# Patient Record
Sex: Female | Born: 1980 | Race: White | Hispanic: No | Smoking: Never smoker
Health system: Southern US, Community
[De-identification: ages and names within clinical notes are randomized; demographics above are authoritative.]

## PROBLEM LIST (undated history)

## (undated) DIAGNOSIS — R87619 Unspecified abnormal cytological findings in specimens from cervix uteri: Secondary | ICD-10-CM

## (undated) DIAGNOSIS — Z8619 Personal history of other infectious and parasitic diseases: Secondary | ICD-10-CM

## (undated) DIAGNOSIS — L270 Generalized skin eruption due to drugs and medicaments taken internally: Secondary | ICD-10-CM

## (undated) DIAGNOSIS — IMO0002 Reserved for concepts with insufficient information to code with codable children: Secondary | ICD-10-CM

## (undated) DIAGNOSIS — T360X5A Adverse effect of penicillins, initial encounter: Secondary | ICD-10-CM

## (undated) HISTORY — DX: Personal history of other infectious and parasitic diseases: Z86.19

## (undated) HISTORY — DX: Adverse effect of penicillins, initial encounter: T36.0X5A

## (undated) HISTORY — PX: COLPOSCOPY: SHX161

## (undated) HISTORY — DX: Reserved for concepts with insufficient information to code with codable children: IMO0002

## (undated) HISTORY — PX: CHOLECYSTECTOMY: SHX55

## (undated) HISTORY — DX: Unspecified abnormal cytological findings in specimens from cervix uteri: R87.619

## (undated) HISTORY — PX: NO PAST SURGERIES: SHX2092

## (undated) HISTORY — DX: Generalized skin eruption due to drugs and medicaments taken internally: L27.0

---

## 2005-09-26 ENCOUNTER — Other Ambulatory Visit: Admission: RE | Admit: 2005-09-26 | Discharge: 2005-09-26 | Payer: Self-pay | Admitting: Obstetrics and Gynecology

## 2006-07-08 HISTORY — PX: CHOLECYSTECTOMY: SHX55

## 2006-11-05 ENCOUNTER — Ambulatory Visit: Payer: Self-pay | Admitting: Internal Medicine

## 2006-11-05 LAB — CONVERTED CEMR LAB
AST: 13 units/L (ref 0–37)
Albumin: 3.4 g/dL — ABNORMAL LOW (ref 3.5–5.2)
Alkaline Phosphatase: 42 units/L (ref 39–117)
Amylase: 41 units/L (ref 27–131)
BUN: 8 mg/dL (ref 6–23)
Basophils Absolute: 0 10*3/uL (ref 0.0–0.1)
Calcium: 9.1 mg/dL (ref 8.4–10.5)
Chloride: 105 meq/L (ref 96–112)
Cholesterol: 219 mg/dL (ref 0–200)
Direct LDL: 127.4 mg/dL
Eosinophils Absolute: 0.1 10*3/uL (ref 0.0–0.6)
GFR calc non Af Amer: 129 mL/min
HDL: 63.1 mg/dL (ref >39.0)
MCHC: 34.7 g/dL (ref 30.0–36.0)
MCV: 82.4 fL (ref 78.0–100.0)
Monocytes Relative: 4.1 % (ref 3.0–11.0)
Neutrophils Relative %: 60.3 % (ref 43.0–77.0)
Platelets: 296 10*3/uL (ref 150–400)
RBC: 4.77 M/uL (ref 3.87–5.11)
Sodium: 140 meq/L (ref 135–145)
Triglycerides: 207 mg/dL (ref 0–149)

## 2006-11-11 ENCOUNTER — Encounter: Admission: RE | Admit: 2006-11-11 | Discharge: 2006-11-11 | Payer: Self-pay | Admitting: Internal Medicine

## 2006-12-05 ENCOUNTER — Ambulatory Visit: Payer: Self-pay | Admitting: Internal Medicine

## 2007-01-19 ENCOUNTER — Ambulatory Visit: Payer: Self-pay | Admitting: Internal Medicine

## 2007-01-30 ENCOUNTER — Telehealth: Payer: Self-pay | Admitting: Internal Medicine

## 2007-02-02 ENCOUNTER — Ambulatory Visit: Payer: Self-pay | Admitting: Internal Medicine

## 2007-02-02 DIAGNOSIS — R1011 Right upper quadrant pain: Secondary | ICD-10-CM

## 2007-02-02 DIAGNOSIS — R519 Headache, unspecified: Secondary | ICD-10-CM | POA: Insufficient documentation

## 2007-02-02 DIAGNOSIS — R51 Headache: Secondary | ICD-10-CM

## 2007-02-09 ENCOUNTER — Encounter: Payer: Self-pay | Admitting: Internal Medicine

## 2007-02-12 ENCOUNTER — Telehealth: Payer: Self-pay | Admitting: Internal Medicine

## 2007-03-02 ENCOUNTER — Encounter: Payer: Self-pay | Admitting: Internal Medicine

## 2007-04-24 ENCOUNTER — Encounter: Payer: Self-pay | Admitting: Internal Medicine

## 2007-08-31 ENCOUNTER — Ambulatory Visit: Payer: Self-pay | Admitting: Internal Medicine

## 2007-08-31 DIAGNOSIS — J019 Acute sinusitis, unspecified: Secondary | ICD-10-CM

## 2007-09-11 ENCOUNTER — Ambulatory Visit: Payer: Self-pay | Admitting: Internal Medicine

## 2007-09-11 DIAGNOSIS — J029 Acute pharyngitis, unspecified: Secondary | ICD-10-CM | POA: Insufficient documentation

## 2007-09-12 ENCOUNTER — Encounter: Payer: Self-pay | Admitting: Internal Medicine

## 2007-09-15 LAB — CONVERTED CEMR LAB
ALT: 24 units/L (ref 0–35)
AST: 20 units/L (ref 0–37)
Basophils Relative: 0.3 % (ref 0.0–1.0)
Bilirubin, Direct: 0.1 mg/dL (ref 0.0–0.3)
CO2: 31 meq/L (ref 19–32)
Calcium: 9.8 mg/dL (ref 8.4–10.5)
Chloride: 104 meq/L (ref 96–112)
Eosinophils Relative: 1.8 % (ref 0.0–5.0)
Glucose, Bld: 75 mg/dL (ref 70–99)
Lymphocytes Relative: 31.6 % (ref 12.0–46.0)
Neutro Abs: 8.3 10*3/uL — ABNORMAL HIGH (ref 1.4–7.7)
Platelets: 281 10*3/uL (ref 150–400)
RBC: 4.81 M/uL (ref 3.87–5.11)
Total Protein: 7.3 g/dL (ref 6.0–8.3)
WBC: 13.3 10*3/uL — ABNORMAL HIGH (ref 4.5–10.5)

## 2007-09-16 ENCOUNTER — Telehealth: Payer: Self-pay | Admitting: Internal Medicine

## 2007-12-28 ENCOUNTER — Encounter: Payer: Self-pay | Admitting: Internal Medicine

## 2008-01-02 IMAGING — US US ABDOMEN COMPLETE
1 series · 14 of 25 positions shown · non-contrast
Comparison: none

CLINICAL DATA: Abdominal pain. 

 ABDOMEN ULTRASOUND:
TECHNIQUE: Complete abdominal ultrasound examination was performed including evaluation of the liver, gallbladder, bile ducts, pancreas, kidneys, spleen, IVC, and abdominal aorta.
 No comparison.

[Series 1: unknown · 0.30mm/px · 14 of 63 slices shown]
[im 1/63]
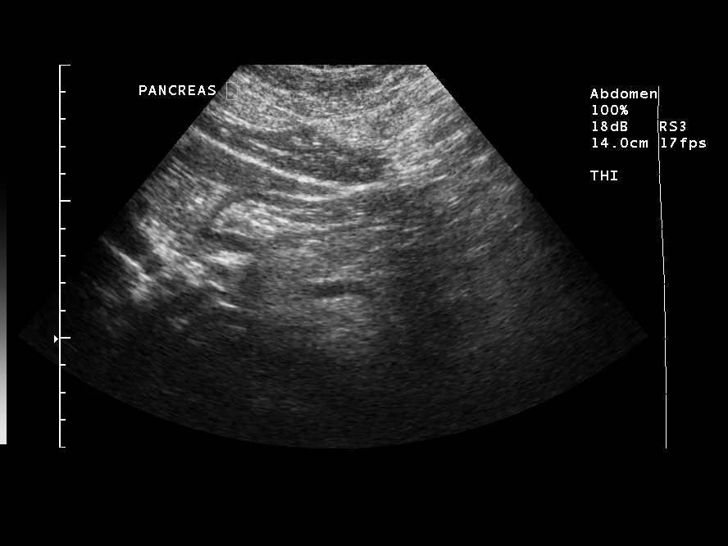
[im 6/63]
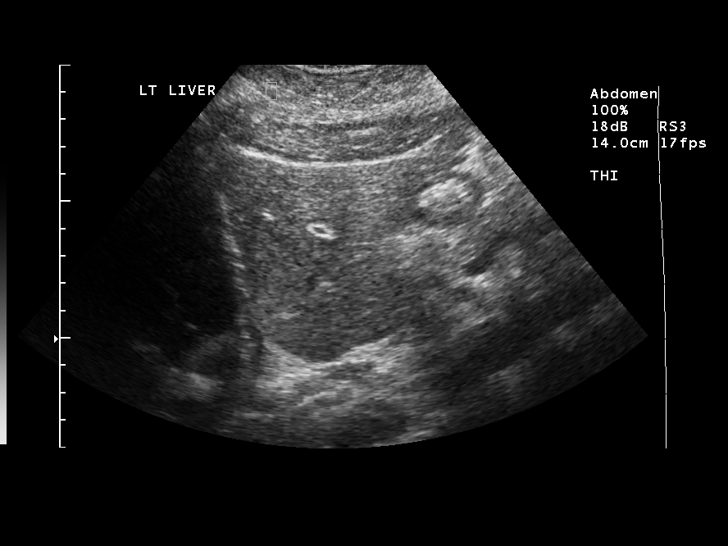
[im 11/63]
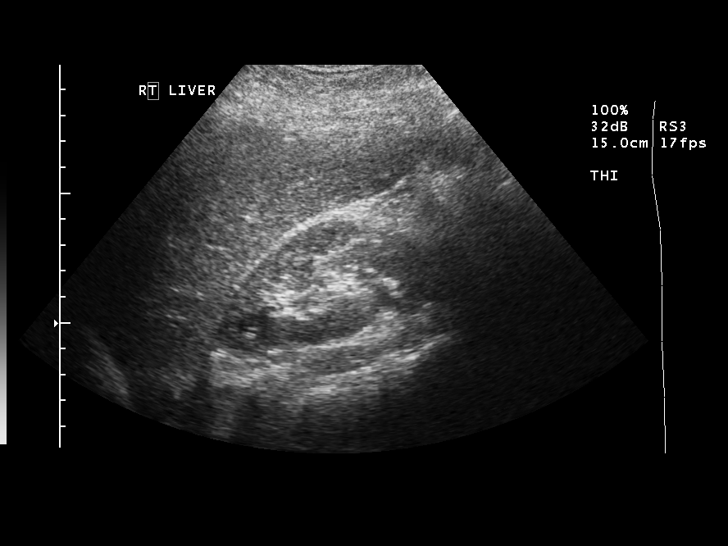
[im 16/63]
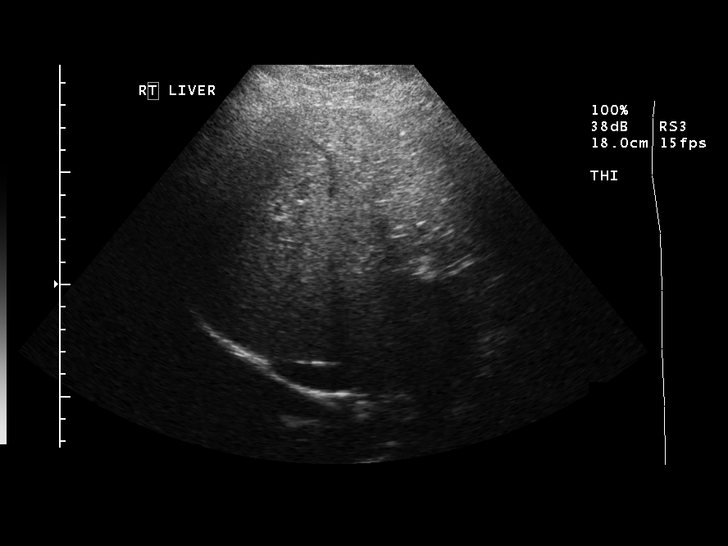
[im 21/63]
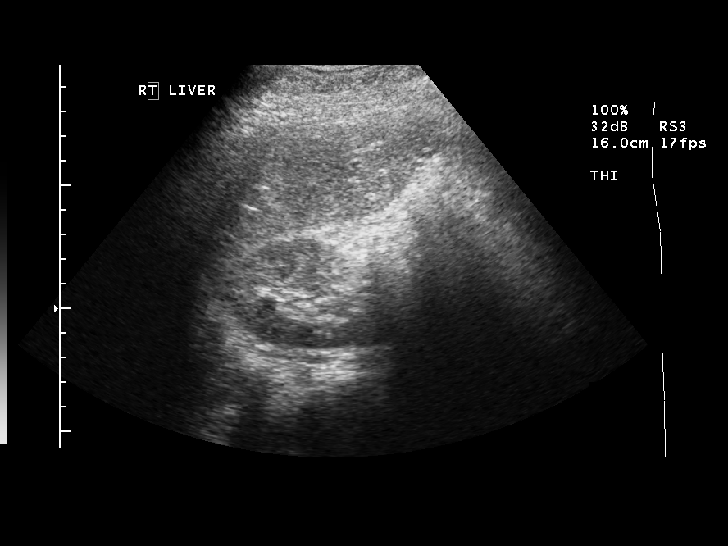
[im 24/63]
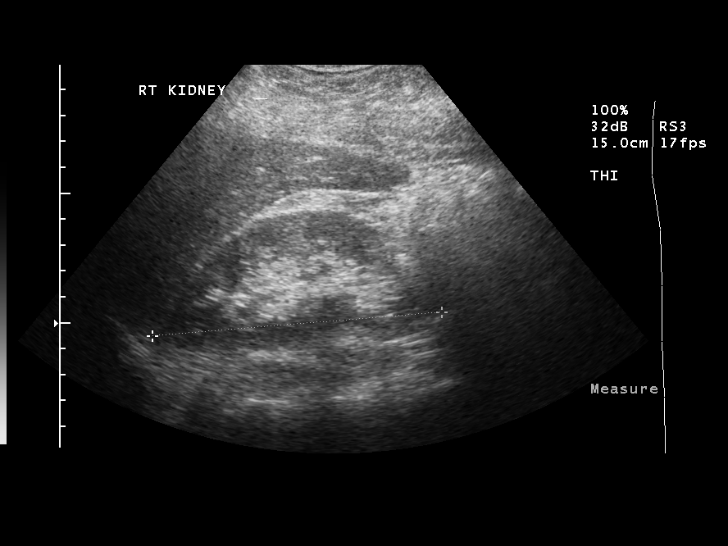
[im 29/63]
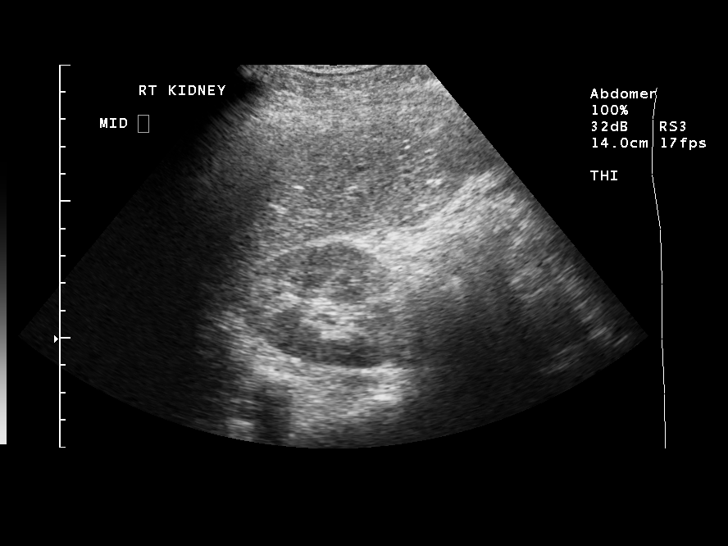
[im 34/63]
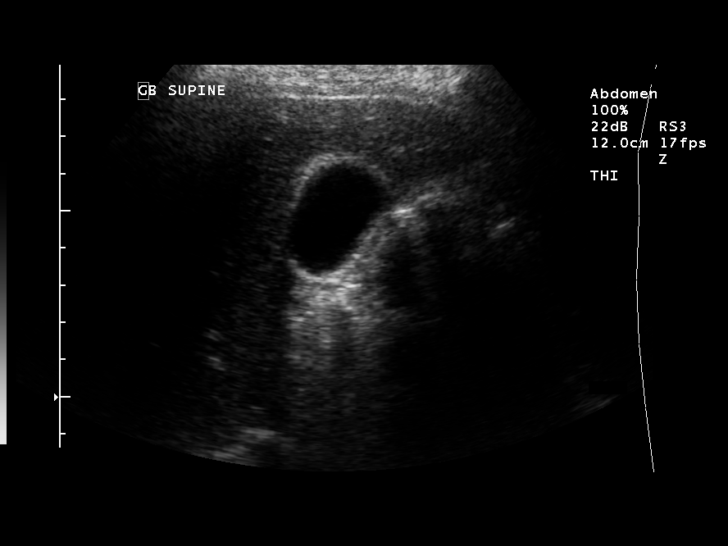
[im 39/63]
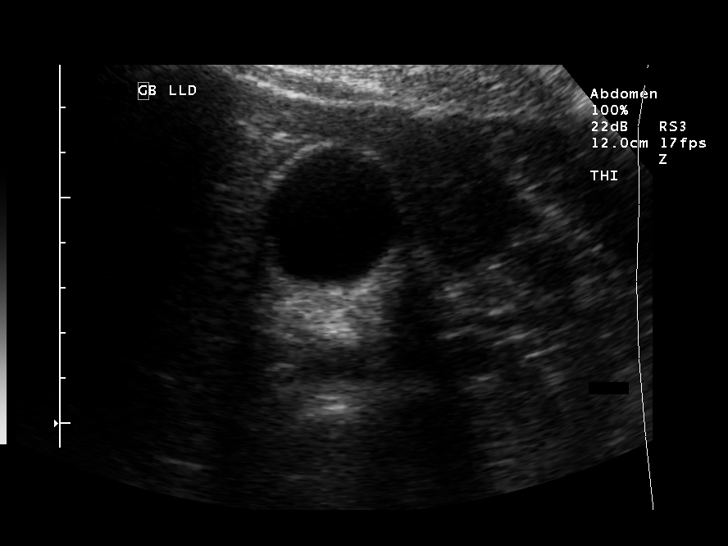
[im 42/63]
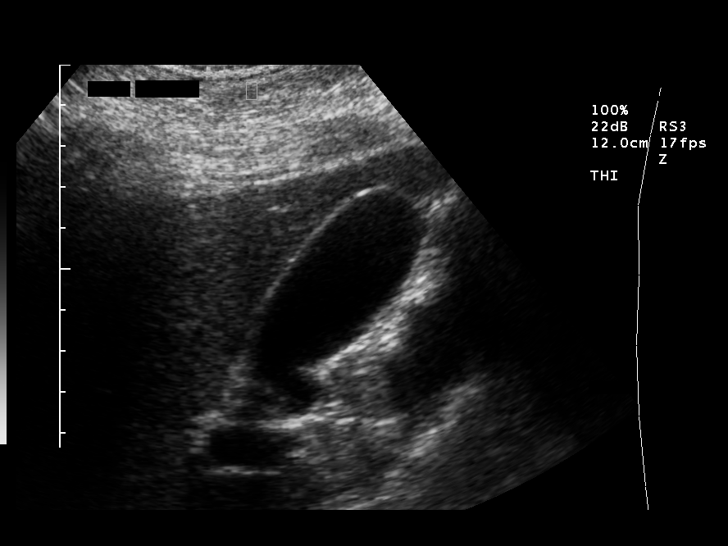
[im 47/63]
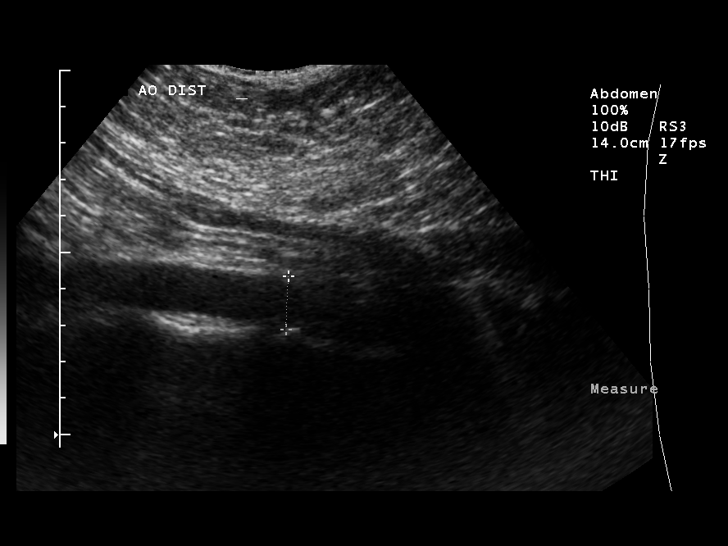
[im 52/63]
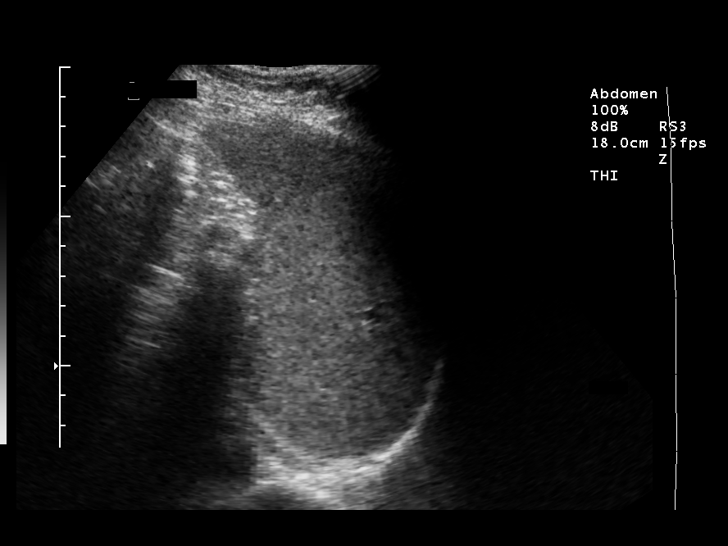
[im 57/63]
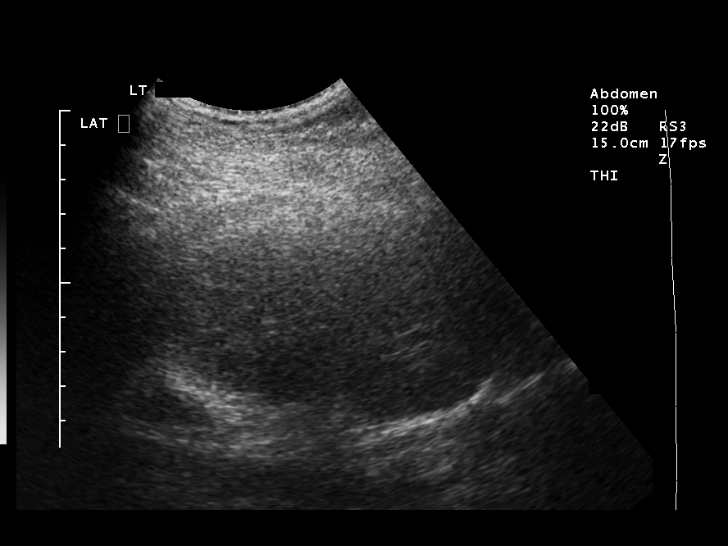
[im 63/63]
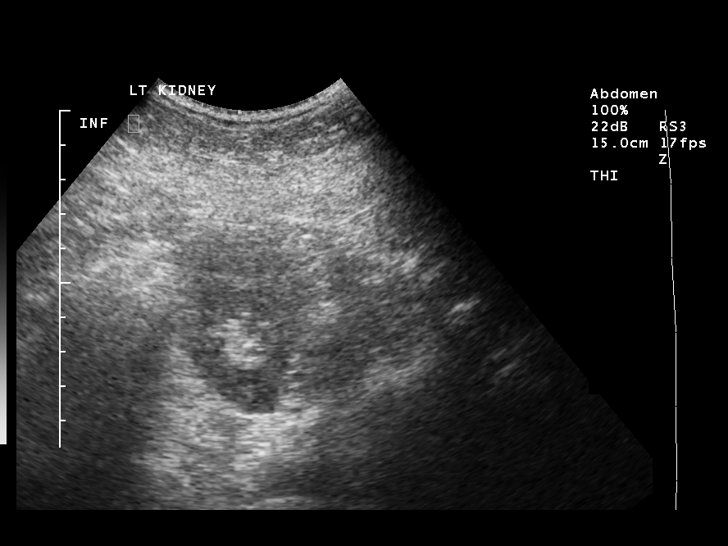

[14 of 25 positions shown; findings below may reference images not displayed]

FINDINGS: Limited evaluation of portions of the pancreas secondary to overlying bowel gas.  The portions visualized are unremarkable.  No focal hepatic lesion or intrahepatic biliary duct dilatation.  No gallstones, gallbladder wall thickening, or pericholecystic fluid.  The liver spans over 15 cm.  
 Right kidney 11.2 cm, left kidney 12.6 cm in length without evidence of hydronephrosis or focal renal mass.  Common bile duct 3 mm.  Aorta and inferior vena cava unremarkable.  The spleen spans over 12.4 cm in length without focal mass.
IMPRESSION: Slightly limited evaluation of portions of the pancreas secondary to overlying bowel gas.  Otherwise negative exam.

## 2008-01-26 ENCOUNTER — Ambulatory Visit: Payer: Self-pay | Admitting: Internal Medicine

## 2008-01-26 DIAGNOSIS — M129 Arthropathy, unspecified: Secondary | ICD-10-CM | POA: Insufficient documentation

## 2008-01-26 LAB — CONVERTED CEMR LAB
ALT: 15 units/L (ref 0–35)
Albumin: 4.1 g/dL (ref 3.5–5.2)
BUN: 9 mg/dL (ref 6–23)
Basophils Absolute: 0 10*3/uL (ref 0.0–0.1)
Basophils Relative: 0.4 % (ref 0.0–3.0)
Bilirubin Urine: NEGATIVE
CO2: 29 meq/L (ref 19–32)
CRP, High Sensitivity: 6 — ABNORMAL HIGH (ref 0.00–5.00)
Calcium: 9.3 mg/dL (ref 8.4–10.5)
Creatinine, Ser: 0.5 mg/dL (ref 0.4–1.2)
Eosinophils Relative: 1.2 % (ref 0.0–5.0)
Glucose, Urine, Semiquant: NEGATIVE
Hemoglobin: 13.5 g/dL (ref 12.0–15.0)
Lymphocytes Relative: 38.1 % (ref 12.0–46.0)
MCHC: 35.2 g/dL (ref 30.0–36.0)
MCV: 83 fL (ref 78.0–100.0)
Neutro Abs: 6.3 10*3/uL (ref 1.4–7.7)
Protein, U semiquant: NEGATIVE
RBC: 4.61 M/uL (ref 3.87–5.11)
Rhuematoid fact SerPl-aCnc: <20 intl units/mL — ABNORMAL LOW (ref 0.0–20.0)
Specific Gravity, Urine: 1.015
Total Bilirubin: 0.6 mg/dL (ref 0.3–1.2)
Total Protein: 7.3 g/dL (ref 6.0–8.3)
Urobilinogen, UA: 0.2

## 2008-02-15 ENCOUNTER — Telehealth: Payer: Self-pay | Admitting: Internal Medicine

## 2008-02-22 ENCOUNTER — Encounter: Payer: Self-pay | Admitting: Internal Medicine

## 2008-03-03 ENCOUNTER — Encounter: Payer: Self-pay | Admitting: Internal Medicine

## 2008-08-14 ENCOUNTER — Telehealth: Payer: Self-pay | Admitting: Family Medicine

## 2009-12-29 ENCOUNTER — Ambulatory Visit: Payer: Self-pay | Admitting: Internal Medicine

## 2009-12-29 DIAGNOSIS — K219 Gastro-esophageal reflux disease without esophagitis: Secondary | ICD-10-CM | POA: Insufficient documentation

## 2009-12-29 DIAGNOSIS — R197 Diarrhea, unspecified: Secondary | ICD-10-CM | POA: Insufficient documentation

## 2010-01-19 ENCOUNTER — Ambulatory Visit: Payer: Self-pay | Admitting: Internal Medicine

## 2010-01-22 ENCOUNTER — Encounter (INDEPENDENT_AMBULATORY_CARE_PROVIDER_SITE_OTHER): Payer: Self-pay | Admitting: *Deleted

## 2010-01-29 ENCOUNTER — Encounter: Payer: Self-pay | Admitting: Internal Medicine

## 2010-02-01 ENCOUNTER — Ambulatory Visit: Payer: Self-pay | Admitting: Internal Medicine

## 2010-02-01 LAB — CONVERTED CEMR LAB: IgA: 133 mg/dL (ref 68–378)

## 2010-04-02 ENCOUNTER — Ambulatory Visit: Payer: Self-pay | Admitting: Internal Medicine

## 2010-04-02 DIAGNOSIS — J309 Allergic rhinitis, unspecified: Secondary | ICD-10-CM | POA: Insufficient documentation

## 2010-04-05 ENCOUNTER — Ambulatory Visit: Payer: Self-pay | Admitting: Internal Medicine

## 2010-04-12 ENCOUNTER — Ambulatory Visit: Payer: Self-pay | Admitting: Internal Medicine

## 2010-04-12 DIAGNOSIS — J329 Chronic sinusitis, unspecified: Secondary | ICD-10-CM | POA: Insufficient documentation

## 2010-04-12 DIAGNOSIS — H6690 Otitis media, unspecified, unspecified ear: Secondary | ICD-10-CM | POA: Insufficient documentation

## 2010-08-07 NOTE — Assessment & Plan Note (Signed)
Summary: ?ear inf/njr   Vital Signs:  Patient profile:   30 year old female Menstrual status:  regular LMP:     03/20/2010 Weight:      186 pounds BMI:     31.06 Temp:     98.4 degrees F oral Pulse rate:   60 / minute BP sitting:   110 / 70  (left arm) Cuff size:   regular  Vitals Entered By: Romualdo Bolk, CMA (AAMA) (April 12, 2010 11:03 AM)  Nutrition Counseling: Patient's BMI is greater than 25 and therefore counseled on weight management options. CC: Pain in rt ear, Pt was also having a clogged, full feeling in both ears but worse in rt. This has been going on for 1.5 months. Pt states that she put [employer] brand of ear wax removal kit in her ears 2-3 days ago. Then this am when she put it only in her rt ear (7am) her ear started hurting. She gets a sharp pain when she yawns or moves jaw. It is also just a constant throbbing pain. LMP (date): 03/20/2010     Enter LMP: 03/20/2010   History of Present Illness: Felicia Shepard comes in today  for congestion for a while and ears clogged at least a week.  however over the last day got right ear pain and tried otc drops ( see above) but pain contniuing. no drainage.   No fever face pain. hard to hear from both ears.  No asthma wheezing  sob.  has anterior neck pain when gets congested. using nasal cortisone.   Saw Dr Leone Payor and dx more IBS and plan is to lose weight and is joining [employer] .   Preventive Screening-Counseling & Management  Alcohol-Tobacco     Alcohol drinks/day: <1     Alcohol type: mixed drinks     Smoking Status: never  Caffeine-Diet-Exercise     Caffeine use/day: 0     Does Patient Exercise: yes     Type of exercise: cardio and wts     Times/week: 3  Current Medications (verified): 1)  Prenatal Vitamins 0.8 Mg Tabs (Prenatal Multivit-Min-Fe-Fa) .... Take One By Mouth Once Daily 2)  Nexium 40 Mg Cpdr (Esomeprazole Magnesium) .Marland Kitchen.. 1 By Mouth 30-60 Minutes Before Breakfast 3)  Hyoscyamine  Sulfate 0.125 Mg Subl (Hyoscyamine Sulfate) .Marland Kitchen.. 1-2 Every 4 Hours As Needed For Abdominal Pain and Diarrhea 4)  Levocetirizine Dihydrochloride 5 Mg Tabs (Levocetirizine Dihydrochloride) .Marland Kitchen.. 1 By Mouth Once Daily 5)  Nasonex 50 Mcg/act Susp (Mometasone Furoate) .Marland Kitchen.. 1-2 Sprays in Each Nostril Qam 6)  Xopenex Hfa 45 Mcg/act Aero (Levalbuterol Tartrate) .Marland Kitchen.. 1-2 Puffs Q 6 Hours  Allergies (verified): 1)  ! Penicillin  Past History:  Past medical, surgical, family and social histories (including risk factors) reviewed, and no changes noted (except as noted below).  Past Medical History: Reviewed history from 04/05/2010 and no changes required. Headache GERD IBS-suspected  Past Surgical History: Reviewed history from 02/01/2010 and no changes required. Lt thumb surgery to loosen a ligament as a baby Cholecystectomy in 2008  Past History:  Care Management: Gynecology: Henderson Cloud Gastroenterology: Leone Payor Allergy: [place] Callas  Family History: Reviewed history from 02/01/2010 and no changes required. mother had leukemia  no IBD Sister with allergy.  12 YO  airborne  Family History of Prostate Cancer: paternal grandfather Family History of Diabetes: maternal grandfather, paternal grandmother Family History of Heart Disease: paternal grandmother, maternal grandfather No FH of Colon Cancer:  Social History: Reviewed history from 04/02/2010  and no changes required. 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   Review of Systems       The patient complains of decreased hearing.  The patient denies anorexia, fever, weight loss, vision loss, peripheral edema, prolonged cough, hemoptysis, melena, hematochezia, transient blindness, difficulty walking, depression, abnormal bleeding, enlarged lymph nodes, and  angioedema.         see hpi  Physical Exam  General:  alert, well-developed, well-nourished, and well-hydrated.  mildly congested non toxic in nad  Head:  normocephalic, atraumatic, and no abnormalities observed.   Eyes:  PERRL, EOMs full, conjunctiva clear  Ears:  L ear normal and no external deformities.   right eat with redened part faclida and yellow mildly bulging opaque pars tensa  middle of tim looks like  a bubble of fluid but no perf   ..no eac tenderness Nose:  congestedno external deformity and no external erythema.  face nontender Mouth:  pharynx pink and moist.   Neck:  shoddy ac nodes  minimally tender Lungs:  Normal respiratory effort, chest expands symmetrically. Lungs are clear to auscultation, no crackles or wheezes.no dullness.   Heart:  Normal rate and regular rhythm. S1 and S2 normal without gallop, murmur, click, rub or other extra sounds. Skin:  turgor normal and color normal.   Cervical Nodes:  shoddy ac noeg pc nodes  Psych:  Oriented X3, good eye contact, and not anxious appearing.     Impression & Recommendations:  Problem # 1:  OTITIS MEDIA, ACUTE, RIGHT (ICD-382.9) rx at sinusitis doses    hx of rash with pcn but can take amox    disc   low risk of se of med   Expectant management  Her updated medication list for this problem includes:    Cefdinir 300 Mg Caps (Cefdinir) .Marland Kitchen... 1 by mouth two times a day  for ear and sinus infection  Instructed on prevention and treatment. Call if no improvement in 48-72 hours or sooner if worsening symptoms.   Problem # 2:  SINUSITIS (ICD-473.9) subacute   with chronic signs so far and eustachian tube dysfunction     Her updated medication list for this problem includes:    Nasonex 50 Mcg/act Susp (Mometasone furoate) .Marland Kitchen... 1-2 sprays in each nostril qam    Cefdinir 300 Mg Caps (Cefdinir) .Marland Kitchen... 1 by mouth two times a day  for ear and sinus infection  Problem # 3:  obesity weight gain  agree with weight watchers    pt  to implement.   Problem # 4:  RHINITIS, VASOMOTOR (ICD-477.9) Assessment: Comment Only  Her updated medication list for this problem includes:    Levocetirizine Dihydrochloride 5 Mg Tabs (Levocetirizine dihydrochloride) .Marland Kitchen... 1 by mouth once daily    Nasonex 50 Mcg/act Susp (Mometasone furoate) .Marland Kitchen... 1-2 sprays in each nostril qam  Complete Medication List: 1)  Prenatal Vitamins 0.8 Mg Tabs (Prenatal multivit-min-fe-fa) .... Take one by mouth once daily 2)  Nexium 40 Mg Cpdr (Esomeprazole magnesium) .Marland Kitchen.. 1 by mouth 30-60 minutes before breakfast 3)  Hyoscyamine Sulfate 0.125 Mg Subl (Hyoscyamine sulfate) .Marland Kitchen.. 1-2 every 4 hours as needed for abdominal pain and diarrhea 4)  Levocetirizine Dihydrochloride 5 Mg Tabs (Levocetirizine dihydrochloride) .Marland Kitchen.. 1 by mouth once daily 5)  Nasonex 50  Mcg/act Susp (Mometasone furoate) .Marland Kitchen.. 1-2 sprays in each nostril qam 6)  Xopenex Hfa 45 Mcg/act Aero (Levalbuterol tartrate) .Marland Kitchen.. 1-2 puffs q 6 hours 7)  Cefdinir 300 Mg Caps (Cefdinir) .Marland Kitchen.. 1 by mouth two times a day  for ear and sinus infection  Patient Instructions: 1)  your have an ear infection and sinus infection probably secondary to your allergy congestion.   2)  expect pain to be bettter in 1-3 days. 3)  hearing should be better in 3-4 week s 4)  call if not better as expected .   Prescriptions: CEFDINIR 300 MG CAPS (CEFDINIR) 1 by mouth two times a day  for ear and sinus infection  #20 x 0   Entered and Authorized by:   Madelin Headings MD   Signed by:   Madelin Headings MD on 04/12/2010   Method used:   Electronically to        [facility] # [phone]* (retail)       [address]       [place], [place]  [address]       Ph: [phone]       Fax: [phone]   RxID:   [id number]

## 2010-08-07 NOTE — Assessment & Plan Note (Signed)
Summary: 2 month f/u//alp   Vital Signs:  Patient profile:   30 year old female Menstrual status:  regular LMP:     03/20/2010 Weight:      188 pounds Pulse rate:   78 / minute BP sitting:   110 / 70  (left arm) Cuff size:   regular  Vitals Entered By: Romualdo Bolk, CMA (AAMA) (April 02, 2010 1:21 PM) CC: Follow-up visit from GI and allergist LMP (date): 03/20/2010     Enter LMP: 03/20/2010   History of Present Illness: Felicia Shepard comes in today  for follow up of  a few issues from last visit.  Allergy:  Neg skin tests.    non allergic rhinitis.    beprev eye drops .    and then  xyzal.    Unclear if helps   and causes drowsiness. and not   nasonex yet.   still ocass cough.  GI  : prob IBS   and gerd  much better. weight loss rec .  no new signs  Plans  lifestyle intervention and exercise . No new signs    Preventive Screening-Counseling & Management  Alcohol-Tobacco     Alcohol drinks/day: <1     Alcohol type: mixed drinks     Smoking Status: never  Caffeine-Diet-Exercise     Caffeine use/day: 0     Does Patient Exercise: yes     Type of exercise: cardio and wts     Times/week: 3  Current Medications (verified): 1)  Prenatal Vitamins 0.8 Mg Tabs (Prenatal Multivit-Min-Fe-Fa) .... Take One By Mouth Once Daily 2)  Nexium 40 Mg Cpdr (Esomeprazole Magnesium) .Marland Kitchen.. 1 By Mouth 30-60 Minutes Before Breakfast 3)  Hyoscyamine Sulfate 0.125 Mg Subl (Hyoscyamine Sulfate) .Marland Kitchen.. 1-2 Every 4 Hours As Needed For Abdominal Pain and Diarrhea 4)  Levocetirizine Dihydrochloride 5 Mg Tabs (Levocetirizine Dihydrochloride) .Marland Kitchen.. 1 By Mouth Once Daily 5)  Nasonex 50 Mcg/act Susp (Mometasone Furoate) .Marland Kitchen.. 1-2 Sprays in Each Nostril Qam 6)  Xopenex Hfa 45 Mcg/act Aero (Levalbuterol Tartrate) .Marland Kitchen.. 1-2 Puffs Q 6 Hours  Allergies (verified): 1)  ! Penicillin  Past History:  Past medical, surgical, family and social histories (including risk factors) reviewed for relevance to  current acute and chronic problems.  Past Medical History: Reviewed history from 02/02/2007 and no changes required. Headache  Past Surgical History: Reviewed history from 02/01/2010 and no changes required. Lt thumb surgery to loosen a ligament as a baby Cholecystectomy in 2008  Past History:  Care Management: Gynecology: Henderson Cloud Gastroenterology: Leone Payor Allergy: [place] Callas  Family History: Reviewed history from 02/01/2010 and no changes required. mother had leukemia  no IBD Sister with allergy.  12 YO  airborne  Family History of Prostate Cancer: paternal grandfather Family History of Diabetes: maternal grandfather, paternal grandmother Family History of Heart Disease: paternal grandmother, maternal grandfather No FH of Colon Cancer:  Social History: Reviewed history from 02/01/2010 and no changes required. 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   Review of Systems  The patient denies anorexia, fever, weight loss, vision loss, prolonged cough, abdominal pain, melena, hematochezia, hematuria, transient blindness, difficulty walking, and angioedema.    Physical Exam  General:  Well-developed,well-nourished,in no acute distress; alert,appropriate and cooperative throughout examination Head:  normocephalic and atraumatic.  Eyes:  vision grossly intact.   Neck:  No deformities, masses, or tenderness noted. Lungs:  Normal respiratory effort, chest expands symmetrically. Lungs are clear to auscultation, no crackles or wheezes. Heart:  Normal rate and regular rhythm. S1 and S2 normal without gallop, murmur, click, rub or other extra sounds. Abdomen:  soft, non-tender, no distention, no masses, no hepatomegaly, and no splenomegaly.   Pulses:  nl cap refill  Neurologic:  non focal  Skin:   turgor normal, color normal, no ecchymoses, and no petechiae.   Cervical Nodes:  No lymphadenopathy noted Psych:  Oriented X3, good eye contact, not anxious appearing, and not depressed appearing.     Impression & Recommendations:  Problem # 1:  DIARRHEA (ICD-787.91) Assessment Improved prob IBS    Problem # 2:  GERD (ICD-530.81) Assessment: Improved plans on weight loss  Her updated medication list for this problem includes:    Nexium 40 Mg Cpdr (Esomeprazole magnesium) .Marland Kitchen... 1 by mouth 30-60 minutes before breakfast    Hyoscyamine Sulfate 0.125 Mg Subl (Hyoscyamine sulfate) .Marland Kitchen... 1-2 every 4 hours as needed for abdominal pain and diarrhea  Problem # 3:  ? of ALLERGY (ICD-995.3) non allergic resp signs   Problem # 4:  weight gain felt from  external factors    lifestyle intervention coubt thyroid or metabolic causes but  at next labs     get lipids  tsh etc.   Complete Medication List: 1)  Prenatal Vitamins 0.8 Mg Tabs (Prenatal multivit-min-fe-fa) .... Take one by mouth once daily 2)  Nexium 40 Mg Cpdr (Esomeprazole magnesium) .Marland Kitchen.. 1 by mouth 30-60 minutes before breakfast 3)  Hyoscyamine Sulfate 0.125 Mg Subl (Hyoscyamine sulfate) .Marland Kitchen.. 1-2 every 4 hours as needed for abdominal pain and diarrhea 4)  Levocetirizine Dihydrochloride 5 Mg Tabs (Levocetirizine dihydrochloride) .Marland Kitchen.. 1 by mouth once daily 5)  Nasonex 50 Mcg/act Susp (Mometasone furoate) .Marland Kitchen.. 1-2 sprays in each nostril qam 6)  Xopenex Hfa 45 Mcg/act Aero (Levalbuterol tartrate) .Marland Kitchen.. 1-2 puffs q 6 hours  Other Orders: Admin 1st Vaccine (42706) Flu Vaccine 54yrs + (23762)  Patient Instructions: 1)  follow up with specialists as discussed. 2)  after lifestyle intervention for weight contol suggest labs such as lipids   thyroid and metabolic panel  as these do not appear to have  been done recently . 3)  Call  us as needed  otherwise .      Flu Vaccine Consent Questions     Do you have a history of severe allergic  reactions to this vaccine? no    Any prior history of allergic reactions to egg and/or gelatin? no    Do you have a sensitivity to the preservative Thimersol? no    Do you have a past history of Guillan-Barre Syndrome? no    Do you currently have an acute febrile illness? no    Have you ever had a severe reaction to latex? no    Vaccine information given and explained to patient? yes    Are you currently pregnant? no    Lot Number:[id number]   Exp Date:01/05/2011   Site Given  Left Deltoid IMlu Romualdo Bolk, CMA (AAMA)  April 02, 2010 1:26 PM

## 2010-08-07 NOTE — Assessment & Plan Note (Signed)
Summary: diarrhea--ch.   History of Present Illness Visit Type: Initial Consult Primary GI MD: Stan Head MD [facility] Primary Provider: Berniece Andreas, MD Requesting Provider: Berniece Andreas, MD Chief Complaint: Increased Diarrhea History of Present Illness:   Patient complains of increased diarrhea since she had her gallbladder removed 2008, she does have a change in bowel habits from time to time that changes to constipation. She sometimes has a BM within 30 mins of a meal. She complains of lower abdominal pain which feels like menstral pain but not during that time of the month, the pain radiates to her back. The bowel habits are unpredictable and the urgent diarrhea is not every day. She states that she eats a balanced diet but now starting to develop reflux, she tried Nexium which worked great but insurance did not want to cover until she tried omeprazole.  heartburn and indigestion began about 2 months ago. She has already been to an allergist. No allergies seen. ? Non-allergic rhinitis. dr. [place] Callas as prescribed Xyzal, Nasonex and Bepuve eye drops bt she has not started.  Cholecystectomy was for gallbladder dyskinesia and she did get improvement in spells of severe sudden indigestion and nausea.  She has ganed 17# since 2008. She does not think she is eatng diferently. She lost 7# on weight watchers last summer. Workouts dropped off during gallbladder attacks and with some other issues has not picked up.   GI Review of Systems    Reports abdominal pain, acid reflux, and  bloating.     Location of  Abdominal pain: lower abdomen.    Denies belching, chest pain, dysphagia with liquids, dysphagia with solids, heartburn, loss of appetite, nausea, vomiting, vomiting blood, weight loss, and  weight gain.      Reports change in bowel habits, constipation, and  diarrhea.     Denies anal fissure, black tarry stools, diverticulosis, fecal incontinence, heme positive stool, hemorrhoids, irritable bowel  syndrome, jaundice, light color stool, liver problems, rectal bleeding, and  rectal pain. Preventive Screening-Counseling & Management      Drug Use:  no.      Current Medications (verified): 1)  Prenatal Vitamins 0.8 Mg Tabs (Prenatal Multivit-Min-Fe-Fa) .... Take One By Mouth Once Daily 2)  Omeprazole 20 Mg Cpdr (Omeprazole) .Marland Kitchen.. 1 By Mouth Once Daily  or As Directed  Allergies (verified): 1)  ! Penicillin  Past History:  Past Medical History: Reviewed history from 02/02/2007 and no changes required. Headache  Past Surgical History: Lt thumb surgery to loosen a ligament as a baby Cholecystectomy in 2008  Family History: mother had leukemia  no IBD Sister with allergy.  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         All other ROS negative except as per HPI.   Vital Signs:  Patient profile:   30 year old female Menstrual status:  regular Height:      65 inches Weight:      190.8 pounds BMI:     31.87 Pulse rate:   104 /  minute Pulse rhythm:   regular BP sitting:   118 / 70  (left arm) Cuff size:   regular  Vitals Entered By: Harlow Mares CMA [facility] Dull) (February 01, 2010 2:17 PM)  Physical Exam  General:  overweight to obese.   Impression & Recommendations:  Problem # 1:  GERD (ICD-530.81) Assessment Comment Only NEW to me Symptoms are fairly classic and responds to PPI. No worrisome signs.Nexium 40 mg more efficacious than omeprazole 20 mg daily.  Nexium 40 mg once daily x at least 2 months. Weight  loss.  Problem # 2:  DIARRHEA (ICD-787.91) Assessment: Comment Only NEW: Probably IBS as opposed to post chole diarrhea as she does get constipated at times. ? celiac disease, will check with Abs   Orders: T-Tissue Transglutamase Ab IgA (60454-09811) TLB-IgA (Immunoglobulin A) (82784-IGA)  Problem # 3:  ? of IBS (ICD-564.1) ? probiotic depending upon how she does with Nexium Levsin as needed for now with follow-up  Patient Instructions: 1)  Please pick up your medications at your pharmacy. 2)  New or changed medications are Nexium (GERD) and hyoscyamine (for abdominal pain/diarrhea) 3)  You need to lose weight. Start by limiting portions, amounts. Avoid eating when not hungry. Limit desserts.Look for high fructose corn syrup on food labels and if in first 3 ingredients, avoid that food. Also try to eat whole grains, avoid "white foods" (e.g. white rice, white bread).   4)  Please go to the basement to have your lab tests drawn today. 5)  Celiac testing. 6)  Please schedule a follow-up appointment in 2 months.  7)  Celiac Sprue brochure given.  8)  Copy sent to : Berniece Andreas, MD 9)  The medication list was reviewed and reconciled.  All changed / newly prescribed medications were explained.  A complete medication list was provided to the patient / caregiver. Prescriptions: HYOSCYAMINE SULFATE 0.125 MG SUBL (HYOSCYAMINE SULFATE) 1-2 every 4 hours as needed for abdominal pain and diarrhea  #60 x 1   Entered and Authorized by:   Iva Boop MD, [facility]   Signed by:   Iva Boop MD, FACG on 02/01/2010   Method used:   Electronically to        [facility] # [phone]* (retail)       [address]       [place], [place]  [address]       Ph: [phone]       Fax: [phone]   RxID:   [id number] NEXIUM 40 MG CPDR (ESOMEPRAZOLE MAGNESIUM) 1 by mouth 30-60 minutes before breakfast  #30 x 3   Entered and Authorized by:   Iva Boop MD, [facility]   Signed by:   Iva Boop  MD, FACG on 02/01/2010   Method used:   Electronically to        [facility] # [phone]* (retail)       [address]       [place], [place]  [address]       Ph: [phone]       Fax: [phone]   RxID:   [id number]

## 2010-08-07 NOTE — Assessment & Plan Note (Signed)
Summary: 3 mth f/u--ch.   History of Present Illness Visit Type: Follow-up Visit Primary GI MD: Stan Head MD [facility] Primary Provider: Berniece Andreas, MD Requesting Provider: na Chief Complaint: Follow up History of Present Illness:   30 yo woman seen about 2 months ago with heartburn and GERD problems as well as some diarrhea. She is doing well on Nexium re: heartburn and indigestion. Her diarrhea is better and her quality of life is ok, she says. She is losing weight and plans to shed significant weight over next year.   GI Review of Systems      Denies abdominal pain, acid reflux, belching, bloating, chest pain, dysphagia with liquids, dysphagia with solids, heartburn, loss of appetite, nausea, vomiting, vomiting blood, weight loss, and  weight gain.        Denies anal fissure, black tarry stools, change in bowel habit, constipation, diarrhea, diverticulosis, fecal incontinence, heme positive stool, hemorrhoids, irritable bowel syndrome, jaundice, light color stool, liver problems, rectal bleeding, and  rectal pain.    Current Medications (verified): 1)  Prenatal Vitamins 0.8 Mg Tabs (Prenatal Multivit-Min-Fe-Fa) .... Take One By Mouth Once Daily 2)  Nexium 40 Mg Cpdr (Esomeprazole Magnesium) .Marland Kitchen.. 1 By Mouth 30-60 Minutes Before Breakfast 3)  Hyoscyamine Sulfate 0.125 Mg Subl (Hyoscyamine Sulfate) .Marland Kitchen.. 1-2 Every 4 Hours As Needed For Abdominal Pain and Diarrhea 4)  Levocetirizine Dihydrochloride 5 Mg Tabs (Levocetirizine Dihydrochloride) .Marland Kitchen.. 1 By Mouth Once Daily 5)  Nasonex 50 Mcg/act Susp (Mometasone Furoate) .Marland Kitchen.. 1-2 Sprays in Each Nostril Qam 6)  Xopenex Hfa 45 Mcg/act Aero (Levalbuterol Tartrate) .Marland Kitchen.. 1-2 Puffs Q 6 Hours  Allergies (verified): 1)  ! Penicillin  Past History:  Past Surgical History: Last updated: 02/01/2010 Lt thumb surgery to loosen a ligament as a baby Cholecystectomy in 2008  Family History: Last updated: 02/01/2010 mother had leukemia  no  IBD Sister with allergy.  12 YO  airborne  Family History of Prostate Cancer: paternal grandfather Family History of Diabetes: maternal grandfather, paternal grandmother Family History of Heart Disease: paternal grandmother, maternal grandfather No FH of Colon Cancer:  Social History: Last updated: 04/02/2010 Married Never Smoked HH of  2  no pets  Occupation: self employed Chief Financial Officer Alcohol Use - yes 1-2 per week Daily Caffeine Use 1 per day Illicit Drug Use - no brother disabled going through guardian ship issues   with father .   Past Medical History: Headache GERD IBS-suspected  Review of Systems       ears feel plugged up  Vital Signs:  Patient profile:   30 year old female Menstrual status:  regular Height:      65 inches Weight:      184 pounds BMI:     30.73 Pulse rate:   76 / minute Pulse rhythm:   regular BP sitting:   110 / 68  (left arm) Cuff size:   regular  Vitals Entered By: Harlow Mares CMA Duncan Dull) (April 05, 2010 12:09 PM)  Physical Exam  General:  healthy appearing.  overweight   Impression & Recommendations:  Problem # 1:  GERD (ICD-530.81) Assessment Improved controlled on Nexium 40 mg once daily. She plans to lose weight and when more successful makes sense to try to taper off Nexium. She can follow-up with Dr. Fabian Sharp and see me as needed.  Problem # 2:  DIARRHEA (ICD-787.91) Assessment: Improved Has gotten better with PPI, ? cause and effect or true-true unrelated. Observe. Has not needed hyoscyamine.  Problem # 3:  ?  of IBS (ICD-564.1) Assessment: Improved I think she does have mild case most likely.  Patient Instructions: 1)  As you lose weight you can try to stop the Nexium by taking every other day for a few weeks then stopping. 2)  Please schedule a follow-up appointment as needed.  3)  You may refill Nexium through Dr. Fabian Sharp if needed. 4)  The medication list was reviewed and reconciled.  All changed / newly  prescribed medications were explained.  A complete medication list was provided to the patient / caregiver. Prescriptions: NEXIUM 40 MG CPDR (ESOMEPRAZOLE MAGNESIUM) 1 by mouth 30-60 minutes before breakfast  #30 x 11   Entered and Authorized by:   Iva Boop MD, [facility]   Signed by:   Iva Boop MD, FACG on 04/05/2010   Method used:   Electronically to        [facility] [address] # [phone]* (retail)       [address]       [place], [place]  [address]       Ph: [phone]       Fax: [phone]   RxID:   [id number]

## 2010-08-07 NOTE — Letter (Signed)
Summary: New Patient letter  [facility]  [address] [place], [place] [address]   Phone: [phone]  Fax: [phone]       01/22/2010 MRN: [id number]  [facility] [address] [place], [place]  [address]  Dear Ms. Forsberg,  Welcome to the Gastroenterology Division at [facility].    You are scheduled to see Dr.  Leone Payor on 02-01-10 at 2:00p.m. on the 3rd floor at [facility], [address]  We ask that you try to arrive at our office 15 minutes prior to your appointment time to allow for check-in.  We would like you to complete the enclosed self-administered evaluation form prior to your visit and bring it with you on the day of your appointment.  We will review it with you.  Also, please bring a complete list of all your medications or, if you prefer, bring the medication bottles and we will list them.  Please bring your insurance card so that we may make a copy of it.  If your insurance requires a referral to see a specialist, please bring your referral form from your primary care physician.  Co-payments are due at the time of your visit and may be paid by cash, check or credit card.     Your office visit will consist of a consult with your physician (includes a physical exam), any laboratory testing he/she may order, scheduling of any necessary diagnostic testing (e.g. x-ray, ultrasound, CT-scan), and scheduling of a procedure (e.g. Endoscopy, Colonoscopy) if required.  Please allow enough time on your schedule to allow for any/all of these possibilities.    If you cannot keep your appointment, please call [phone] to cancel or reschedule prior to your appointment date.  This allows [place] the opportunity to schedule an appointment for another patient in need of care.  If you do not cancel or reschedule by 5 p.m. the business day prior to your appointment date, you will be charged a $50.00 late cancellation/no-show fee.    Thank you for choosing  [facility] for your medical needs.  We appreciate the opportunity to care for you.  Please visit [place] at our website  to learn more about our practice.                     Sincerely,                                                             The Gastroenterology Division

## 2010-08-07 NOTE — Assessment & Plan Note (Signed)
Summary: consult re: allergy and indigestion issues/cjr   Vital Signs:  Patient profile:   30 year old female Menstrual status:  regular LMP:     12/11/2009 Height:      65 inches Weight:      189 pounds BMI:     31.56 Pulse rate:   80 / minute BP sitting:   120 / 80  (left arm) Cuff size:   regular  Vitals Entered By: Romualdo Bolk, CMA (AAMA) (December 29, 2009 1:46 PM) CC: Pt is here to discuss allergy issues, Pt is having pet allergies. Pt gets a deep cough and she would always get like this when she was around the dog. Her throat will close up, face gets itchy and swells up. Pt is also having some acid reflux. Pt states that it is random foods that she eats. Keeps her up at night when she eats. She has been having more diarrhea as well. This as well can be random foods that cause diarrhea. Pt did notice some blood in her stool for the past few months but saw the gyn and they done a stool test which was neg. LMP (date): 12/11/2009     Menstrual Status regular Enter LMP: 12/11/2009   History of Present Illness: Felicia Shepard comes in today  for above problems.  Post prandial diarrhea  for a while ocass constipation   sometimes watery.   Doesnt seem to be related to what she eats  but ? if  allergic or not.   Had GB out a few year ago and doesn't  remember this severity of signs .No travel related  GI issues. Also  noted cough and congestion with dog exposures  and other ?   could she be allergic?  Has heart burn and burning with certain foods  worse with supine  most recently . Just started pepcid and tums ? if help Has had  thyroid level tested in past per gyne. No recent labs   Preventive Screening-Counseling & Management  Alcohol-Tobacco     Alcohol drinks/day: <1     Alcohol type: mixed drinks     Smoking Status: never  Caffeine-Diet-Exercise     Caffeine use/day: 0     Does Patient Exercise: yes     Type of exercise: cardio and wts     Times/week: 3  Current  Medications (verified): 1)  Prenatal Vitamins 0.8 Mg Tabs (Prenatal Multivit-Min-Fe-Fa)  Allergies (verified): No Known Drug Allergies  Past History:  Past medical, surgical, family and social histories (including risk factors) reviewed, and no changes noted (except as noted below).  Past Medical History: Reviewed history from 02/02/2007 and no changes required. Headache  Past Surgical History: Reviewed history from 01/26/2008 and no changes required. Lt thumb surgery to loosen a ligament as a baby   Cholecystectomy  Past History:  Care Management: Gynecology: [facility]  Family History: Reviewed history from 01/26/2008 and no changes required. ncparent had leukemia  and fam hc of cancer  arthritis. ? no RA   no IBD Sister with allergy.  12 YO  airborne   Social History: Reviewed history from 08/31/2007 and no changes required. Married Never Smoked HH of  2  no pets Caffeine use/day:  0  Review of Systems       The patient complains of severe indigestion/heartburn.  The patient denies anorexia, fever, weight loss, vision loss, decreased hearing, chest pain, dyspnea on exertion, prolonged cough, abdominal pain, melena, hematochezia, muscle weakness, difficulty walking, depression,  abnormal bleeding, enlarged lymph nodes, and angioedema.         indigestion   otc     tums or otc. meds     Physical Exam  General:  Well-developed,well-nourished,in no acute distress; alert,appropriate and cooperative throughout examination Head:  normocephalic and atraumatic.   Eyes:  vision grossly intact, pupils equal, and pupils round.   Ears:  R ear normal and L ear normal.   Nose:  no external deformity and no external erythema.  mild congestion Mouth:  pharynx pink and moist.   Neck:  No deformities, masses, or tenderness noted. Lungs:  Normal respiratory effort, chest expands symmetrically. Lungs are clear to auscultation, no crackles or wheezes. Heart:  Normal rate and regular  rhythm. S1 and S2 normal without gallop, murmur, click, rub or other extra sounds. Abdomen:  Bowel sounds positive,abdomen soft and non-tender without masses, organomegaly or  noted. Pulses:  nl cap refill  Extremities:  no clubbing cyanosis or edema  Neurologic:  non focal  Skin:  turgor normal, color normal, no suspicious lesions, and no ecchymoses.   Cervical Nodes:  No lymphadenopathy noted Psych:  Oriented X3, normally interactive, good eye contact, not anxious appearing, and not depressed appearing.     Impression & Recommendations:  Problem # 1:  GERD (ICD-530.81)  Her updated medication list for this problem includes:    Nexium 40 Mg Cpdr (Esomeprazole magnesium) .Marland Kitchen... 1 by mouth once daily  Orders: Allergy Referral  (Allergy)  Problem # 2:  ? of ALLERGY (ICD-995.3)  cough to dog   seems reactive and    poss other allergies    reasonable to be evaluated  to asses .  Adults less likely to have food allergies but  consider eval for this with the GI signs   Orders: Allergy Referral  (Allergy)  Problem # 3:  post prandial diarrhea poss post choly  but newer onset   .      prob not food allergy but will refer for signs .   If persistent or  progressive  will get Gi involved   Complete Medication List: 1)  Prenatal Vitamins 0.8 Mg Tabs (Prenatal multivit-min-fe-fa) 2)  Nexium 40 Mg Cpdr (Esomeprazole magnesium) .Marland Kitchen.. 1 by mouth once daily  Patient Instructions: 1)  try nexium  each day for your heart burn 2)  avoid  late night eating and drinking. 3)  Will do a allergy referral 4)  Consider  empiric trail of   flagyl for the bowel problem  5)  If not improving   rec we get GI to see you.  6)  rov  in 3 weeks or as needed.

## 2010-08-07 NOTE — Assessment & Plan Note (Signed)
Summary: Follow up/cb   Vital Signs:  Patient profile:   30 year old female Menstrual status:  regular LMP:     01/09/2010 Weight:      189 pounds Pulse rate:   60 / minute BP sitting:   120 / 80  (left arm) Cuff size:   regular  Vitals Entered By: Romualdo Bolk, CMA (AAMA) (January 19, 2010 2:43 PM) CC: Follow-up visit on nexium LMP (date): 01/09/2010     Enter LMP: 01/09/2010   History of Present Illness: Felicia Shepard  comes in today  for follow up of a number of issues   GERD  nexium helps a lot .. got a good response almost immediatlely.    no se of med seen. on this for 2-3  weeks.  Diarrhea  :off and on    constipation  and then  loose stools and immedincy .  worse over the past year no fever or weightl loss. Cough  and coryza.  has appt with allergist soon  Preventive Screening-Counseling & Management  Alcohol-Tobacco     Alcohol drinks/day: <1     Alcohol type: mixed drinks     Smoking Status: never  Caffeine-Diet-Exercise     Caffeine use/day: 0     Does Patient Exercise: yes     Type of exercise: cardio and wts     Times/week: 3  Current Medications (verified): 1)  Prenatal Vitamins 0.8 Mg Tabs (Prenatal Multivit-Min-Fe-Fa) 2)  Nexium 40 Mg Cpdr (Esomeprazole Magnesium) .Marland Kitchen.. 1 By Mouth Once Daily  Allergies (verified): No Known Drug Allergies  Past History:  Past Surgical History: Lt thumb surgery to loosen a ligament as a baby   Cholecystectomy in 2008  Past History:  Care Management: Gynecology: [facility] PMH-FH-SH reviewed-no changes except otherwise noted  Review of Systems  The patient denies anorexia, fever, weight loss, vision loss, melena, hematochezia, incontinence, abnormal bleeding, and enlarged lymph nodes.         weight  gain over the past 3 years   Physical Exam  General:  Well-developed,well-nourished,in no acute distress; alert,appropriate and cooperative throughout examination Head:  normocephalic and atraumatic.     Eyes:  vision grossly intact.   Neck:  No deformities, masses, or tenderness noted. Lungs:  Normal respiratory effort, chest expands symmetrically. Lungs are clear to auscultation, no crackles or wheezes. Heart:  Normal rate and regular rhythm. S1 and S2 normal without gallop, murmur, click, rub or other extra sounds. Abdomen:  soft, non-tender, no distention, no masses, no hepatomegaly, and no splenomegaly.   Pulses:  pulses intact without delay   Skin:  turgor normal and color normal.   Cervical Nodes:  No lymphadenopathy noted   Impression & Recommendations:  Problem # 1:  GERD (ICD-530.81) Assessment Improved will change over to generic   trial  and plan is to try for about 2  months and do lifestyle intervention and then change over to  h2 blocker or  other and off.    Her updated medication list for this problem includes:    Nexium 40 Mg Cpdr (Esomeprazole magnesium) .Marland Kitchen... 1 by mouth once daily    Omeprazole 20 Mg Cpdr (Omeprazole) .Marland Kitchen... 1 by mouth once daily  or as directed  Problem # 2:  DIARRHEA (ICD-787.91)  irreg bowel habits    poss dates all the way back to her Chole  but not sure.    problematic for her   and interrupts   red responsibilities  Orders: Gastroenterology Referral (  GI)  Problem # 3:  ? of ALLERGY (ICD-995.3) consult pending.  Complete Medication List: 1)  Prenatal Vitamins 0.8 Mg Tabs (Prenatal multivit-min-fe-fa) 2)  Nexium 40 Mg Cpdr (Esomeprazole magnesium) .Marland Kitchen.. 1 by mouth once daily 3)  Omeprazole 20 Mg Cpdr (Omeprazole) .Marland Kitchen.. 1 by mouth once daily  or as directed  Patient Instructions: 1)  weight loss 5 pounds can help reflux    2)  Trial of generic omeprazole    for now for the next 2-3 month  . 3)  Lifestyle intervention  in addition.   to help reflux control. 4)  ROV in 2 months   5)  will  arrange a  GI consult about the bowel problems in the meantime.  Prescriptions: OMEPRAZOLE 20 MG CPDR (OMEPRAZOLE) 1 by mouth once daily  or as directed   #30 x 3   Entered and Authorized by:   Madelin Headings MD   Signed by:   Madelin Headings MD on 01/19/2010   Method used:   Electronically to        [facility] # [phone]* (retail)       [address]       [place], [place]  [address]       Ph: [phone]       Fax: [phone]   RxID:   [id number]

## 2010-08-07 NOTE — Consult Note (Signed)
Summary: [facility]  [facility]   Imported By: Maryln Gottron 02/06/2010 15:53:36  _____________________________________________________________________  External Attachment:    Type:   Image     Comment:   External Document

## 2010-08-16 ENCOUNTER — Telehealth: Payer: Self-pay | Admitting: *Deleted

## 2010-08-16 NOTE — Telephone Encounter (Signed)
Notified pt. 

## 2010-08-16 NOTE — Telephone Encounter (Signed)
Go ahead and refer 

## 2010-08-16 NOTE — Telephone Encounter (Signed)
Pt continues to have ringing in her ears, and even unusual sounds at times.  Would like a referral to ENT.

## 2010-11-23 NOTE — Assessment & Plan Note (Signed)
[facility] OFFICE NOTE   JOHNATHON, MITTAL                           MRN:          [id number]  DATE:11/05/2006                            DOB:          1981/03/09    CHIEF COMPLAINT:  New patient to establish for problem with stomach.   HISTORY OF PRESENT ILLNESS:  Ms. Goga is a 30 year old, nonsmoking,  married white female who comes in today for the above reason.  She was  previously under the care of [facility] but wants to change  primary physician in regard to this particular problem.  She was in her  usual state of health until October 2007 when she did become married,  and since that time has gained about 20 pounds for no reason that she  can tell.  Notes a right upper scapular shoulder pain in about December  that was felt to be muscle spasm from sitting at the computer and  treated with a muscle relaxant.  However, since January she has had a  problem with nausea and GE reflux type symptoms, most recently  postprandial nausea, about an hour later has diarrhea after eating with  no associated bloody diarrhea.  Has had occasional vomiting with that.  She has had negative EPT a couple of times, although she is on birth  control pills.  Over the last month she has felt that her exercise  capacity is down for no particular reason, although she has not been  exercising because she doesn't feel well.  She does sleep well at  night.  No symptoms of obstructive sleep apnea, no chest pain or  shortness of breath except as above.   REVIEW OF SYSTEMS:  Negative for significant edema.  She does have  migraine headaches about 2-3 times a week, possibly related to working  with the computer screen.  Occasionally takes Excedrin or Advil.  She  did have side effects from Imitrex in the past.  GU: Negative.  SKIN:  No acute changes.  No fevers, bloody diarrhea.  There was blood in stool  at some point, but her  previous doctor felt that it was a perianal  issue.  The rest of the Review of Systems is negative or  noncontributory.  She has had her thyroid checked.  She comes in with  labs today.  See chart.   PAST MEDICAL HISTORY:   SURGERIES:  None.   She is primiparous.  Last Pap February 2008.  LMP September 20, 2006.  Chicken pox as a child.  Headaches as above.   MEDICATIONS:  1. Mondonesi birth control pill.  2. Omeprazole 20 mg which she has been on since January.   DRUG ALLERGIES:  None recorded.   FAMILY HISTORY:  Negative for GI disease, thyroid, or osteoporosis.  Mother died at age 18 from leukemia fairly suddenly.  Father with  hypertension, elevated cholesterol, grandparent's generation with type 2  diabetes, hypertension, heart disease, and elevated cholesterol.   SOCIAL HISTORY:  Household of 2.  No recent travel.  They did go to  [place] on their honeymoon in the fall but had no GI symptoms at that  time, and her husband has no GI symptoms.  Two to six alcoholic  beverages a month at most.  No tobacco.  One to two diet Coke's a day.  There was no caffeine except for the Excedrin.  Exercises fairly  regularly 30-45 minutes 2-3 times a week.  She gets 8-9 hours of sleep.   PHYSICAL EXAMINATION:  VITAL SIGNS:  Height 5 feet 5-1/2 inches.  Weight  172.  Pulse 72 and regular, blood pressure 120/80.  GENERAL: Healthy-appearing young adult in no acute distress.  She is not  icteric.  HEENT:  Grossly normal.  TMs clear.  Oropharynx clear.  NECK:  Without masses.  Thyroid palpable, but no nodules are noted.  No  JVD.  CHEST:  Clear to auscultation and percussion, equal.  CARDIAC:  S1, S2.  There is a 1/6 systolic ejection murmur when lying  with no click or radiation at the sternal border.  EXTREMITIES:  Peripheral pulses are present without delay.  Negative  cyanosis, clubbing, or edema.  ABDOMEN:  Soft without organomegaly, guarding, or rebound.  NEUROLOGIC:  Intact.    LABORATORY DATA:  Laboratories done 3 weeks ago at [facility] was  normal except for blood sugar of 132.  Sed rate 1.07.  Hemoglobin 13.1,  hematocrit 36.4, MCV borderline low at 10.6, RDW 11.   IMPRESSION:  1. Abdominal symptoms, sound like reflux, although I would not rule      out biliary disease and suggest we get an abdominal ultrasound, H.      pylori, CBC, sed rate.  Repeat her LFTs and urinalysis today and a      lipid panel as she has not had this done.  2. History of increase in weight over the last 6 months.  This is      probably related to lifestyle.  3. Fatigue.  Unclear if this is deconditioning exercise intolerance,      although her exam is okay today as far as cardiovascular.  She does      have a 1/6 flow murmur,  probably insignificant.  RV in about one      month after putting her on samples of Nexium 1 a day, getting      ultrasound and labs and following up at that time or p.r.n.     Neta Mends. Panosh, MD  Electronically Signed    WKP/MedQ  DD: 11/05/2006  DT: 11/05/2006  Job #: [id number]

## 2012-02-18 ENCOUNTER — Ambulatory Visit (INDEPENDENT_AMBULATORY_CARE_PROVIDER_SITE_OTHER): Payer: BC Managed Care – PPO | Admitting: Family Medicine

## 2012-02-18 VITALS — BP 106/62 | HR 76 | Temp 98.2°F | Resp 16 | Ht 65.0 in | Wt 180.0 lb

## 2012-02-18 DIAGNOSIS — M549 Dorsalgia, unspecified: Secondary | ICD-10-CM

## 2012-02-18 DIAGNOSIS — Z3490 Encounter for supervision of normal pregnancy, unspecified, unspecified trimester: Secondary | ICD-10-CM

## 2012-02-18 LAB — POCT URINALYSIS DIPSTICK
Bilirubin, UA: NEGATIVE
Glucose, UA: NEGATIVE
Ketones, UA: NEGATIVE
Spec Grav, UA: 1.01

## 2012-02-18 LAB — POCT UA - MICROSCOPIC ONLY
Bacteria, U Microscopic: NEGATIVE
Crystals, Ur, HPF, POC: NEGATIVE
RBC, urine, microscopic: NEGATIVE

## 2012-02-18 MED ORDER — CYCLOBENZAPRINE HCL 5 MG PO TABS: ORAL_TABLET | ORAL | Status: DC

## 2012-02-18 NOTE — Patient Instructions (Addendum)

## 2012-02-18 NOTE — Progress Notes (Addendum)
Subjective: 31 year old female with back pain and right flank. This started over the weekend when they're at the beach. She did not do anything that she knows of to cause it to be painful restrained. It hurts to take a deep breath or cough, or to move in certain angles. She has sudden acute spasms of pain sometimes when she moves wrong. She has not had a lot of these troubles in the past. She work doing Chief Financial Officer. She has not been doing a lot of physical activity. She is pregnant about 12 weeks, her first pregnancy. She called her OB/GYN who recommended she see a primary care.  She has not had any urinary symptoms or fever. She has a little residual cough, but has not been ill with a respiratory tract problems. She's had her gallbladder out.  Objective: Pleasant alert young lady in no acute distress. Her chest was clear. Chest wall is nontender. She's tender in the right paraspinous muscles of the lower back. Motion of the back is good until she twisted to the left and she had intense pain of the right low back. Abdomen was soft without masses or tenderness. Uterus is not significantly palpable abdominally yet.  Assessment: Back strain First trimester pregnancy  Plan: Flexeril (pregnancy category B.) and ibuprofen and heat Check urinalysis  Results for orders placed in visit on 02/18/12  POCT URINALYSIS DIPSTICK      Component Value Range   Color, UA yellow     Clarity, UA clear     Glucose, UA neg     Bilirubin, UA neg     Ketones, UA neg     Spec Grav, UA 1.010     Blood, UA trace-lysed     pH, UA 7.0     Protein, UA neg     Urobilinogen, UA 0.2     Nitrite, UA neg     Leukocytes, UA Negative    POCT UA - MICROSCOPIC ONLY      Component Value Range   WBC, Ur, HPF, POC 0-1     RBC, urine, microscopic neg     Bacteria, U Microscopic neg     Mucus, UA neg     Epithelial cells, urine per micros 0-1     Crystals, Ur, HPF, POC neg     Casts, Ur, LPF, POC neg     Yeast, UA neg      rx's back

## 2012-02-21 LAB — OB RESULTS CONSOLE HIV ANTIBODY (ROUTINE TESTING): HIV: NONREACTIVE

## 2012-02-21 LAB — OB RESULTS CONSOLE RUBELLA ANTIBODY, IGM: Rubella: IMMUNE

## 2012-02-21 LAB — OB RESULTS CONSOLE ABO/RH

## 2012-02-21 LAB — OB RESULTS CONSOLE ANTIBODY SCREEN: Antibody Screen: NEGATIVE

## 2012-02-21 LAB — OB RESULTS CONSOLE HEPATITIS B SURFACE ANTIGEN: Hepatitis B Surface Ag: NEGATIVE

## 2012-08-16 ENCOUNTER — Telehealth: Payer: Self-pay

## 2012-08-16 NOTE — Telephone Encounter (Signed)
RTC is my guess. Please advise.

## 2012-08-16 NOTE — Telephone Encounter (Signed)
Pt is [redacted] weeks pregnant and has been exposed to pink eye and she woke up today with symptoms, would like to know if it is possible to call something in so that she is not exposed to more things in the lobby  Best number [phone]

## 2012-08-17 NOTE — Telephone Encounter (Signed)
RTC

## 2012-08-18 NOTE — Telephone Encounter (Signed)
Left message for Roseanne to call me back. Unfortunately she will need office visit.

## 2012-08-18 NOTE — Telephone Encounter (Signed)
Called patient again, left detailed message for her explaining need for office visit.

## 2012-09-05 ENCOUNTER — Encounter (HOSPITAL_COMMUNITY): Payer: Self-pay | Admitting: *Deleted

## 2012-09-05 ENCOUNTER — Inpatient Hospital Stay (HOSPITAL_COMMUNITY)
Admission: AD | Admit: 2012-09-05 | Discharge: 2012-09-05 | Disposition: A | Discharge status: Home / Self Care | Payer: BC Managed Care – PPO | Source: Ambulatory Visit | Attending: Obstetrics & Gynecology | Admitting: Obstetrics & Gynecology

## 2012-09-05 DIAGNOSIS — O479 False labor, unspecified: Secondary | ICD-10-CM | POA: Insufficient documentation

## 2012-09-05 NOTE — MAU Note (Signed)
Membranes stripped today at office visit about 1600. Regular ctxs since 1930.

## 2012-09-05 NOTE — MAU Note (Signed)
PT SAYS SHE HURT BAD AT 730PM- THEN WORSE   AT 0130.  VE IN OFFICE 3-4 CM  .   DENIES HSV AND MRSA

## 2012-09-07 ENCOUNTER — Encounter (HOSPITAL_COMMUNITY): Payer: Self-pay

## 2012-09-07 ENCOUNTER — Telehealth (HOSPITAL_COMMUNITY): Payer: Self-pay | Admitting: *Deleted

## 2012-09-07 ENCOUNTER — Encounter (HOSPITAL_COMMUNITY): Payer: Self-pay | Admitting: *Deleted

## 2012-09-07 ENCOUNTER — Inpatient Hospital Stay (HOSPITAL_COMMUNITY)
Admission: AD | Admit: 2012-09-07 | Discharge: 2012-09-11 | DRG: 371 | Disposition: A | Discharge status: Home / Self Care | Payer: BC Managed Care – PPO | Source: Ambulatory Visit | Attending: Obstetrics and Gynecology | Admitting: Obstetrics and Gynecology

## 2012-09-07 DIAGNOSIS — O3660X0 Maternal care for excessive fetal growth, unspecified trimester, not applicable or unspecified: Secondary | ICD-10-CM | POA: Diagnosis present

## 2012-09-07 DIAGNOSIS — O34219 Maternal care for unspecified type scar from previous cesarean delivery: Secondary | ICD-10-CM

## 2012-09-07 DIAGNOSIS — O4290 Premature rupture of membranes, unspecified as to length of time between rupture and onset of labor, unspecified weeks of gestation: Secondary | ICD-10-CM | POA: Diagnosis present

## 2012-09-07 LAB — CBC
HCT: 35.8 % — ABNORMAL LOW (ref 36.0–46.0)
Hemoglobin: 12 g/dL (ref 12.0–15.0)
MCH: 28.8 pg (ref 26.0–34.0)
MCHC: 33.5 g/dL (ref 30.0–36.0)

## 2012-09-07 MED ORDER — CITRIC ACID-SODIUM CITRATE 334-500 MG/5ML PO SOLN
30.0000 mL | ORAL | Status: DC | PRN
  Administered 2012-09-07 – 2012-09-09 (×3): 30 mL via ORAL
  Filled 2012-09-07 (×3): qty 15

## 2012-09-07 MED ORDER — LACTATED RINGERS IV SOLN
INTRAVENOUS | Status: DC
  Administered 2012-09-07 – 2012-09-09 (×6): via INTRAVENOUS

## 2012-09-07 MED ORDER — OXYTOCIN BOLUS FROM INFUSION: 500.0000 mL | INTRAVENOUS | Status: DC

## 2012-09-07 MED ORDER — OXYCODONE-ACETAMINOPHEN 5-325 MG PO TABS: 1.0000 | ORAL_TABLET | ORAL | Status: DC | PRN

## 2012-09-07 MED ORDER — ONDANSETRON HCL 4 MG/2ML IJ SOLN
4.0000 mg | Freq: Four times a day (QID) | INTRAMUSCULAR | Status: DC | PRN
  Administered 2012-09-08: 4 mg via INTRAVENOUS
  Filled 2012-09-07 (×2): qty 2

## 2012-09-07 MED ORDER — ACETAMINOPHEN 325 MG PO TABS
650.0000 mg | ORAL_TABLET | ORAL | Status: DC | PRN
  Administered 2012-09-09: 650 mg via ORAL
  Filled 2012-09-07: qty 2

## 2012-09-07 MED ORDER — FLEET ENEMA 7-19 GM/118ML RE ENEM: 1.0000 | ENEMA | RECTAL | Status: DC | PRN

## 2012-09-07 MED ORDER — EPHEDRINE 5 MG/ML INJ: 10.0000 mg | INTRAVENOUS | Status: DC | PRN

## 2012-09-07 MED ORDER — OXYTOCIN 40 UNITS IN LACTATED RINGERS INFUSION - SIMPLE MED: 62.5000 mL/h | INTRAVENOUS | Status: DC

## 2012-09-07 MED ORDER — PHENYLEPHRINE 40 MCG/ML (10ML) SYRINGE FOR IV PUSH (FOR BLOOD PRESSURE SUPPORT)
80.0000 ug | PREFILLED_SYRINGE | INTRAVENOUS | Status: DC | PRN
  Filled 2012-09-07: qty 5

## 2012-09-07 MED ORDER — LIDOCAINE HCL (PF) 1 % IJ SOLN: 30.0000 mL | INTRAMUSCULAR | Status: DC | PRN

## 2012-09-07 MED ORDER — LACTATED RINGERS IV SOLN
500.0000 mL | Freq: Once | INTRAVENOUS | Status: AC
  Administered 2012-09-08: 13:00:00 via INTRAVENOUS

## 2012-09-07 MED ORDER — OXYTOCIN 40 UNITS IN LACTATED RINGERS INFUSION - SIMPLE MED
1.0000 m[IU]/min | INTRAVENOUS | Status: DC
  Administered 2012-09-07: 2 m[IU]/min via INTRAVENOUS
  Administered 2012-09-08: 28 m[IU]/min via INTRAVENOUS
  Administered 2012-09-08: 20 m[IU]/min via INTRAVENOUS
  Filled 2012-09-07: qty 1000

## 2012-09-07 MED ORDER — IBUPROFEN 600 MG PO TABS: 600.0000 mg | ORAL_TABLET | Freq: Four times a day (QID) | ORAL | Status: DC | PRN

## 2012-09-07 MED ORDER — LACTATED RINGERS IV SOLN
500.0000 mL | INTRAVENOUS | Status: DC | PRN
  Administered 2012-09-08: 1000 mL via INTRAVENOUS
  Administered 2012-09-08: 500 mL via INTRAVENOUS

## 2012-09-07 MED ORDER — DIPHENHYDRAMINE HCL 50 MG/ML IJ SOLN: 12.5000 mg | INTRAMUSCULAR | Status: DC | PRN

## 2012-09-07 MED ORDER — EPHEDRINE 5 MG/ML INJ
10.0000 mg | INTRAVENOUS | Status: DC | PRN
  Filled 2012-09-07: qty 4

## 2012-09-07 MED ORDER — PHENYLEPHRINE 40 MCG/ML (10ML) SYRINGE FOR IV PUSH (FOR BLOOD PRESSURE SUPPORT): 80.0000 ug | PREFILLED_SYRINGE | INTRAVENOUS | Status: DC | PRN

## 2012-09-07 MED ORDER — TERBUTALINE SULFATE 1 MG/ML IJ SOLN
0.2500 mg | Freq: Once | INTRAMUSCULAR | Status: AC | PRN
  Filled 2012-09-07: qty 1

## 2012-09-07 MED ORDER — FENTANYL 2.5 MCG/ML BUPIVACAINE 1/10 % EPIDURAL INFUSION (WH - ANES)
14.0000 mL/h | INTRAMUSCULAR | Status: DC
  Administered 2012-09-08 – 2012-09-09 (×2): 14 mL/h via EPIDURAL
  Filled 2012-09-07 (×3): qty 125

## 2012-09-07 NOTE — Telephone Encounter (Signed)
Preadmission screen  

## 2012-09-08 LAB — TYPE AND SCREEN
ABO/RH(D): A POS
Antibody Screen: NEGATIVE

## 2012-09-08 LAB — COMPREHENSIVE METABOLIC PANEL
ALT: 9 U/L (ref 0–35)
AST: 14 U/L (ref 0–37)
CO2: 22 mEq/L (ref 19–32)
Chloride: 102 mEq/L (ref 96–112)
GFR calc non Af Amer: >90 mL/min (ref >90)
Sodium: 137 mEq/L (ref 135–145)
Total Bilirubin: 0.2 mg/dL — ABNORMAL LOW (ref 0.3–1.2)

## 2012-09-08 LAB — CBC
MCHC: 33.6 g/dL (ref 30.0–36.0)
Platelets: 153 10*3/uL (ref 150–400)
RDW: 13.6 % (ref 11.5–15.5)

## 2012-09-08 LAB — ABO/RH: ABO/RH(D): A POS

## 2012-09-08 MED ORDER — FENTANYL 2.5 MCG/ML BUPIVACAINE 1/10 % EPIDURAL INFUSION (WH - ANES)
INTRAMUSCULAR | Status: DC | PRN
  Administered 2012-09-08: 14 mL/h via EPIDURAL

## 2012-09-08 MED ORDER — LACTATED RINGERS IV SOLN
INTRAVENOUS | Status: DC
  Administered 2012-09-08: via INTRAUTERINE

## 2012-09-08 MED ORDER — LIDOCAINE HCL (PF) 1 % IJ SOLN
INTRAMUSCULAR | Status: DC | PRN
  Administered 2012-09-08 (×2): 8 mL

## 2012-09-08 NOTE — Anesthesia Procedure Notes (Signed)
Epidural Patient location during procedure: OB Start time: 09/08/2012 2:38 PM End time: 09/08/2012 2:42 PM  Staffing Anesthesiologist: Sandrea Hughs Performed by: anesthesiologist   Preanesthetic Checklist Completed: patient identified, site marked, surgical consent, pre-op evaluation, timeout performed, IV checked, risks and benefits discussed and monitors and equipment checked  Epidural Patient position: sitting Prep: site prepped and draped and DuraPrep Patient monitoring: continuous pulse ox and blood pressure Approach: midline Injection technique: LOR air  Needle:  Needle type: Tuohy  Needle gauge: 17 G Needle length: 9 cm and 9 Needle insertion depth: 5 cm cm Catheter type: closed end flexible Catheter size: 19 Gauge Catheter at skin depth: 10 cm Test dose: negative and Other  Assessment Sensory level: T9 Events: blood not aspirated, injection not painful, no injection resistance, negative IV test and no paresthesia  Additional Notes Reason for block:procedure for pain

## 2012-09-08 NOTE — Progress Notes (Signed)
Epidural request, MD ordering recent CBC

## 2012-09-08 NOTE — Progress Notes (Signed)
Pt comfortable.  SVE 5/C/-2 FHTs 120s gstv, NST R, class 1  Continue.

## 2012-09-08 NOTE — H&P (Signed)
32 y.o. [redacted]w[redacted]d  G1P0 comes in c/o of gross ROM about 8pm.  Otherwise has good fetal movement and no bleeding.  No contractions since ROM.  Past Medical History  Diagnosis Date  . Abnormal Pap smear   . Hx of varicella     Past Surgical History  Procedure Laterality Date  . Cholecystectomy    . Cholecystectomy  2008  . Colposcopy    . No past surgeries      OB History   Grav Para Term Preterm Abortions TAB SAB Ect Mult Living   1              # Outc Date GA Lbr Len/2nd Wgt Sex Del Anes PTL Lv   1 CUR               History   Social History  . Marital Status: Married    Spouse Name: N/A    Number of Children: N/A  . Years of Education: N/A   Occupational History  . Not on file.   Social History Main Topics  . Smoking status: Never Smoker   . Smokeless tobacco: Not on file  . Alcohol Use: No  . Drug Use: No  . Sexually Active: Yes   Other Topics Concern  . Not on file   Social History Narrative  . No narrative on file   [place]    Prenatal Transfer Tool  Maternal Diabetes: No Genetic Screening: Declined Fetal Ultrasounds or other Referrals:  Other:  normal anatomy scan Maternal Substance Abuse:  No Significant Maternal Medications:  None Significant Maternal Lab Results: Lab values include: Group B Strep negative  Other PNC: Patient had another [place] last week for suspected S>D, [place] not yet available but per pt, baby was est 8#3 and was 90%.      Filed Vitals:   09/08/12 0701  BP: 130/65  Pulse: 79  Temp:   Resp:      Lungs/Cor:  NAD Abdomen:  soft, gravid Ex:  no cords, erythema SVE:  3 in office and in MAU FHTs: 120 good STV, NST R Toco:  q irreg currently   A/P   Admit to L&D for PROM with suspected LGA Can wait one hr then if not contracting start pitocin 2x2  GBS Neg per pt - obtain report from office     [place], [facility]

## 2012-09-08 NOTE — Progress Notes (Signed)
From office:  GBS was neg and last u/s on 2-20 was 8#3. FHTs 120s NST R, gstv, class 1

## 2012-09-08 NOTE — Anesthesia Preprocedure Evaluation (Signed)
Anesthesia Evaluation  Patient identified by MRN, date of birth, ID band Patient awake    Reviewed: Allergy & Precautions, H&P , NPO status , Patient's Chart, lab work & pertinent test results  Airway Mallampati: I TM Distance: >3 FB Neck ROM: full    Dental no notable dental hx.    Pulmonary neg pulmonary ROS,    Pulmonary exam normal       Cardiovascular negative cardio ROS      Neuro/Psych negative psych ROS   GI/Hepatic negative GI ROS, Neg liver ROS,   Endo/Other  negative endocrine ROS  Renal/GU negative Renal ROS  negative genitourinary   Musculoskeletal negative musculoskeletal ROS (+)   Abdominal Normal abdominal exam  (+)   Peds  Hematology negative hematology ROS (+)   Anesthesia Other Findings   Reproductive/Obstetrics (+) Pregnancy                           Anesthesia Physical Anesthesia Plan  ASA: II  Anesthesia Plan: Epidural   Post-op Pain Management:    Induction:   Airway Management Planned:   Additional Equipment:   Intra-op Plan:   Post-operative Plan:   Informed Consent: I have reviewed the patients History and Physical, chart, labs and discussed the procedure including the risks, benefits and alternatives for the proposed anesthesia with the patient or authorized representative who has indicated his/her understanding and acceptance.     Plan Discussed with:   Anesthesia Plan Comments:         Anesthesia Quick Evaluation

## 2012-09-09 ENCOUNTER — Encounter (HOSPITAL_COMMUNITY)
Admission: AD | Disposition: A | Discharge status: Home / Self Care | Payer: Self-pay | Source: Ambulatory Visit | Attending: Obstetrics and Gynecology

## 2012-09-09 ENCOUNTER — Inpatient Hospital Stay (HOSPITAL_COMMUNITY): Payer: BC Managed Care – PPO | Admitting: Anesthesiology

## 2012-09-09 ENCOUNTER — Encounter (HOSPITAL_COMMUNITY): Payer: Self-pay | Admitting: Anesthesiology

## 2012-09-09 SURGERY — Surgical Case
Anesthesia: Epidural | Site: Abdomen | Wound class: Clean Contaminated

## 2012-09-09 MED ORDER — SODIUM CHLORIDE 0.9 % IJ SOLN: 3.0000 mL | INTRAMUSCULAR | Status: DC | PRN

## 2012-09-09 MED ORDER — METOCLOPRAMIDE HCL 5 MG/ML IJ SOLN
10.0000 mg | Freq: Three times a day (TID) | INTRAMUSCULAR | Status: DC | PRN
  Administered 2012-09-09: 10 mg via INTRAVENOUS
  Filled 2012-09-09: qty 2

## 2012-09-09 MED ORDER — DIPHENHYDRAMINE HCL 50 MG/ML IJ SOLN: 25.0000 mg | INTRAMUSCULAR | Status: DC | PRN

## 2012-09-09 MED ORDER — OXYCODONE-ACETAMINOPHEN 5-325 MG PO TABS
1.0000 | ORAL_TABLET | ORAL | Status: DC | PRN
  Administered 2012-09-10 (×2): 1 via ORAL
  Filled 2012-09-09 (×2): qty 1

## 2012-09-09 MED ORDER — LACTATED RINGERS IV SOLN
INTRAVENOUS | Status: DC | PRN
  Administered 2012-09-09: 05:00:00 via INTRAVENOUS

## 2012-09-09 MED ORDER — ONDANSETRON HCL 4 MG/2ML IJ SOLN
4.0000 mg | INTRAMUSCULAR | Status: DC | PRN
  Administered 2012-09-09: 4 mg via INTRAVENOUS
  Filled 2012-09-09: qty 2

## 2012-09-09 MED ORDER — NALOXONE HCL 0.4 MG/ML IJ SOLN: 0.4000 mg | INTRAMUSCULAR | Status: DC | PRN

## 2012-09-09 MED ORDER — KETOROLAC TROMETHAMINE 60 MG/2ML IM SOLN
INTRAMUSCULAR | Status: AC
  Administered 2012-09-09: 60 mg via INTRAMUSCULAR
  Filled 2012-09-09: qty 2

## 2012-09-09 MED ORDER — MEPERIDINE HCL 25 MG/ML IJ SOLN: 6.2500 mg | INTRAMUSCULAR | Status: DC | PRN

## 2012-09-09 MED ORDER — FENTANYL CITRATE 0.05 MG/ML IJ SOLN
INTRAMUSCULAR | Status: AC
  Administered 2012-09-09: 50 ug
  Filled 2012-09-09: qty 2

## 2012-09-09 MED ORDER — MEASLES, MUMPS & RUBELLA VAC ~~LOC~~ INJ
0.5000 mL | INJECTION | Freq: Once | SUBCUTANEOUS | Status: DC
  Filled 2012-09-09: qty 0.5

## 2012-09-09 MED ORDER — IBUPROFEN 600 MG PO TABS
600.0000 mg | ORAL_TABLET | Freq: Four times a day (QID) | ORAL | Status: DC
  Administered 2012-09-09 – 2012-09-11 (×7): 600 mg via ORAL
  Filled 2012-09-09 (×4): qty 1

## 2012-09-09 MED ORDER — METHYLERGONOVINE MALEATE 0.2 MG PO TABS: 0.2000 mg | ORAL_TABLET | ORAL | Status: DC | PRN

## 2012-09-09 MED ORDER — DIBUCAINE 1 % RE OINT: 1.0000 "application " | TOPICAL_OINTMENT | RECTAL | Status: DC | PRN

## 2012-09-09 MED ORDER — ONDANSETRON HCL 4 MG/2ML IJ SOLN
INTRAMUSCULAR | Status: DC | PRN
  Administered 2012-09-09: 4 mg via INTRAVENOUS

## 2012-09-09 MED ORDER — NALOXONE HCL 1 MG/ML IJ SOLN
1.0000 ug/kg/h | INTRAVENOUS | Status: DC | PRN
  Filled 2012-09-09: qty 2

## 2012-09-09 MED ORDER — MORPHINE SULFATE (PF) 0.5 MG/ML IJ SOLN
INTRAMUSCULAR | Status: DC | PRN
  Administered 2012-09-09: 4 mg via EPIDURAL

## 2012-09-09 MED ORDER — CYCLOBENZAPRINE HCL 10 MG PO TABS
10.0000 mg | ORAL_TABLET | Freq: Three times a day (TID) | ORAL | Status: DC | PRN
  Filled 2012-09-09: qty 1

## 2012-09-09 MED ORDER — NALBUPHINE SYRINGE 5 MG/0.5 ML
5.0000 mg | INJECTION | INTRAMUSCULAR | Status: DC | PRN
  Filled 2012-09-09: qty 1

## 2012-09-09 MED ORDER — DIPHENHYDRAMINE HCL 25 MG PO CAPS
25.0000 mg | ORAL_CAPSULE | ORAL | Status: DC | PRN
  Filled 2012-09-09: qty 1

## 2012-09-09 MED ORDER — SIMETHICONE 80 MG PO CHEW: 80.0000 mg | CHEWABLE_TABLET | ORAL | Status: DC | PRN

## 2012-09-09 MED ORDER — FAMOTIDINE 20 MG PO TABS
20.0000 mg | ORAL_TABLET | Freq: Two times a day (BID) | ORAL | Status: DC
  Administered 2012-09-10 – 2012-09-11 (×2): 20 mg via ORAL
  Filled 2012-09-09 (×6): qty 1

## 2012-09-09 MED ORDER — ONDANSETRON HCL 4 MG/2ML IJ SOLN: 4.0000 mg | Freq: Three times a day (TID) | INTRAMUSCULAR | Status: DC | PRN

## 2012-09-09 MED ORDER — FERROUS SULFATE 325 (65 FE) MG PO TABS
325.0000 mg | ORAL_TABLET | Freq: Two times a day (BID) | ORAL | Status: DC
  Administered 2012-09-10 – 2012-09-11 (×3): 325 mg via ORAL
  Filled 2012-09-09 (×3): qty 1

## 2012-09-09 MED ORDER — ONDANSETRON HCL 4 MG PO TABS: 4.0000 mg | ORAL_TABLET | ORAL | Status: DC | PRN

## 2012-09-09 MED ORDER — OXYTOCIN 40 UNITS IN LACTATED RINGERS INFUSION - SIMPLE MED: 62.5000 mL/h | INTRAVENOUS | Status: AC

## 2012-09-09 MED ORDER — FENTANYL CITRATE 0.05 MG/ML IJ SOLN: 25.0000 ug | INTRAMUSCULAR | Status: DC | PRN

## 2012-09-09 MED ORDER — KETOROLAC TROMETHAMINE 30 MG/ML IJ SOLN: 30.0000 mg | Freq: Four times a day (QID) | INTRAMUSCULAR | Status: AC | PRN

## 2012-09-09 MED ORDER — MEPERIDINE HCL 25 MG/ML IJ SOLN
INTRAMUSCULAR | Status: AC
  Filled 2012-09-09: qty 1

## 2012-09-09 MED ORDER — TETANUS-DIPHTH-ACELL PERTUSSIS 5-2.5-18.5 LF-MCG/0.5 IM SUSP
0.5000 mL | Freq: Once | INTRAMUSCULAR | Status: AC
  Administered 2012-09-10: 0.5 mL via INTRAMUSCULAR
  Filled 2012-09-09: qty 0.5

## 2012-09-09 MED ORDER — LANOLIN HYDROUS EX OINT: 1.0000 "application " | TOPICAL_OINTMENT | CUTANEOUS | Status: DC | PRN

## 2012-09-09 MED ORDER — BISACODYL 10 MG RE SUPP: 10.0000 mg | Freq: Every day | RECTAL | Status: DC | PRN

## 2012-09-09 MED ORDER — KETOROLAC TROMETHAMINE 60 MG/2ML IM SOLN
60.0000 mg | Freq: Once | INTRAMUSCULAR | Status: AC | PRN
  Filled 2012-09-09: qty 2

## 2012-09-09 MED ORDER — SODIUM BICARBONATE 8.4 % IV SOLN
INTRAVENOUS | Status: DC | PRN
  Administered 2012-09-09: 5 mL via EPIDURAL

## 2012-09-09 MED ORDER — KETOROLAC TROMETHAMINE 30 MG/ML IJ SOLN
30.0000 mg | Freq: Four times a day (QID) | INTRAMUSCULAR | Status: AC | PRN
  Administered 2012-09-09: 30 mg via INTRAVENOUS
  Filled 2012-09-09: qty 1

## 2012-09-09 MED ORDER — WITCH HAZEL-GLYCERIN EX PADS: 1.0000 "application " | MEDICATED_PAD | CUTANEOUS | Status: DC | PRN

## 2012-09-09 MED ORDER — PHENYLEPHRINE HCL 10 MG/ML IJ SOLN
INTRAMUSCULAR | Status: DC | PRN
  Administered 2012-09-09 (×5): 80 ug via INTRAVENOUS

## 2012-09-09 MED ORDER — SENNOSIDES-DOCUSATE SODIUM 8.6-50 MG PO TABS
2.0000 | ORAL_TABLET | Freq: Every day | ORAL | Status: DC
  Administered 2012-09-09 – 2012-09-10 (×2): 2 via ORAL

## 2012-09-09 MED ORDER — SCOPOLAMINE 1 MG/3DAYS TD PT72
MEDICATED_PATCH | TRANSDERMAL | Status: AC
  Filled 2012-09-09: qty 1

## 2012-09-09 MED ORDER — ZOLPIDEM TARTRATE 5 MG PO TABS: 5.0000 mg | ORAL_TABLET | Freq: Every evening | ORAL | Status: DC | PRN

## 2012-09-09 MED ORDER — LACTATED RINGERS IV SOLN
INTRAVENOUS | Status: DC
  Administered 2012-09-09 (×2): via INTRAVENOUS

## 2012-09-09 MED ORDER — SIMETHICONE 80 MG PO CHEW
80.0000 mg | CHEWABLE_TABLET | Freq: Three times a day (TID) | ORAL | Status: DC
  Administered 2012-09-09 – 2012-09-11 (×7): 80 mg via ORAL

## 2012-09-09 MED ORDER — CEFAZOLIN SODIUM-DEXTROSE 2-3 GM-% IV SOLR
INTRAVENOUS | Status: DC | PRN
  Administered 2012-09-09: 2 g via INTRAVENOUS

## 2012-09-09 MED ORDER — METHYLERGONOVINE MALEATE 0.2 MG/ML IJ SOLN: 0.2000 mg | INTRAMUSCULAR | Status: DC | PRN

## 2012-09-09 MED ORDER — PHENYLEPHRINE 40 MCG/ML (10ML) SYRINGE FOR IV PUSH (FOR BLOOD PRESSURE SUPPORT)
PREFILLED_SYRINGE | INTRAVENOUS | Status: AC
  Filled 2012-09-09: qty 10

## 2012-09-09 MED ORDER — MEPERIDINE HCL 25 MG/ML IJ SOLN
INTRAMUSCULAR | Status: DC | PRN
  Administered 2012-09-09: 25 mg via INTRAVENOUS

## 2012-09-09 MED ORDER — SCOPOLAMINE 1 MG/3DAYS TD PT72
1.0000 | MEDICATED_PATCH | Freq: Once | TRANSDERMAL | Status: DC
  Administered 2012-09-09: 1.5 mg via TRANSDERMAL

## 2012-09-09 MED ORDER — CLINDAMYCIN PHOSPHATE 900 MG/50ML IV SOLN
900.0000 mg | Freq: Once | INTRAVENOUS | Status: AC
  Administered 2012-09-09: 900 mg via INTRAVENOUS
  Filled 2012-09-09: qty 50

## 2012-09-09 MED ORDER — METOCLOPRAMIDE HCL 5 MG/ML IJ SOLN: 10.0000 mg | Freq: Once | INTRAMUSCULAR | Status: DC | PRN

## 2012-09-09 MED ORDER — DIPHENHYDRAMINE HCL 25 MG PO CAPS: 25.0000 mg | ORAL_CAPSULE | Freq: Four times a day (QID) | ORAL | Status: DC | PRN

## 2012-09-09 MED ORDER — DIPHENHYDRAMINE HCL 50 MG/ML IJ SOLN: 12.5000 mg | INTRAMUSCULAR | Status: DC | PRN

## 2012-09-09 MED ORDER — PRENATAL MULTIVITAMIN CH
1.0000 | ORAL_TABLET | Freq: Every day | ORAL | Status: DC
  Administered 2012-09-10 – 2012-09-11 (×2): 1 via ORAL
  Filled 2012-09-09 (×2): qty 1

## 2012-09-09 MED ORDER — FLEET ENEMA 7-19 GM/118ML RE ENEM: 1.0000 | ENEMA | Freq: Every day | RECTAL | Status: DC | PRN

## 2012-09-09 MED ORDER — MORPHINE SULFATE 0.5 MG/ML IJ SOLN
INTRAMUSCULAR | Status: AC
  Filled 2012-09-09: qty 10

## 2012-09-09 MED ORDER — IBUPROFEN 600 MG PO TABS
600.0000 mg | ORAL_TABLET | Freq: Four times a day (QID) | ORAL | Status: DC | PRN
  Filled 2012-09-09 (×3): qty 1

## 2012-09-09 MED ORDER — MENTHOL 3 MG MT LOZG: 1.0000 | LOZENGE | OROMUCOSAL | Status: DC | PRN

## 2012-09-09 MED ORDER — 0.9 % SODIUM CHLORIDE (POUR BTL) OPTIME
TOPICAL | Status: DC | PRN
  Administered 2012-09-09: 1000 mL

## 2012-09-09 SURGICAL SUPPLY — 32 items
CLOTH BEACON ORANGE TIMEOUT ST (SAFETY) ×2 IMPLANT
DERMABOND ADVANCED (GAUZE/BANDAGES/DRESSINGS) ×1
DERMABOND ADVANCED .7 DNX12 (GAUZE/BANDAGES/DRESSINGS) ×1 IMPLANT
DRAPE LG THREE QUARTER DISP (DRAPES) ×2 IMPLANT
DRSG OPSITE POSTOP 4X10 (GAUZE/BANDAGES/DRESSINGS) ×2 IMPLANT
DURAPREP 26ML APPLICATOR (WOUND CARE) ×2 IMPLANT
ELECT REM PT RETURN 9FT ADLT (ELECTROSURGICAL) ×2
ELECTRODE REM PT RTRN 9FT ADLT (ELECTROSURGICAL) ×1 IMPLANT
EXTRACTOR VACUUM BELL STYLE (SUCTIONS) IMPLANT
GLOVE BIO SURGEON STRL SZ7 (GLOVE) ×2 IMPLANT
GOWN STRL REIN XL XLG (GOWN DISPOSABLE) ×4 IMPLANT
KIT ABG SYR 3ML LUER SLIP (SYRINGE) ×2 IMPLANT
NEEDLE HYPO 25X5/8 SAFETYGLIDE (NEEDLE) ×2 IMPLANT
NS IRRIG 1000ML POUR BTL (IV SOLUTION) ×2 IMPLANT
PACK C SECTION WH (CUSTOM PROCEDURE TRAY) ×2 IMPLANT
PAD OB MATERNITY 4.3X12.25 (PERSONAL CARE ITEMS) ×2 IMPLANT
RETRACTOR WND ALEXIS 25 LRG (MISCELLANEOUS) ×1 IMPLANT
RTRCTR WOUND ALEXIS 25CM LRG (MISCELLANEOUS) ×2
SLEEVE SCD COMPRESS KNEE MED (MISCELLANEOUS) ×2 IMPLANT
STAPLER VISISTAT 35W (STAPLE) IMPLANT
SUT MNCRL 0 VIOLET CTX 36 (SUTURE) ×2 IMPLANT
SUT MONOCRYL 0 CTX 36 (SUTURE) ×2
SUT PDS AB 0 CTX 60 (SUTURE) IMPLANT
SUT PLAIN 2 0 XLH (SUTURE) ×2 IMPLANT
SUT VIC AB 0 CT1 27 (SUTURE) ×2
SUT VIC AB 0 CT1 27XBRD ANBCTR (SUTURE) ×2 IMPLANT
SUT VIC AB 2-0 CT1 27 (SUTURE) ×1
SUT VIC AB 2-0 CT1 TAPERPNT 27 (SUTURE) ×1 IMPLANT
SUT VIC AB 4-0 KS 27 (SUTURE) ×2 IMPLANT
TOWEL OR 17X24 6PK STRL BLUE (TOWEL DISPOSABLE) ×6 IMPLANT
TRAY FOLEY CATH 14FR (SET/KITS/TRAYS/PACK) ×2 IMPLANT
WATER STERILE IRR 1000ML POUR (IV SOLUTION) IMPLANT

## 2012-09-09 NOTE — Progress Notes (Signed)
Dr. Henderson Cloud called, notified of FHR tracing and interventions, MD reviewing tracing, orders to wait until FHR reactive and then restart Pitocin at 19mu/min

## 2012-09-09 NOTE — Progress Notes (Addendum)
Dr. Henderson Cloud called, notified of FHR tracing, orders to check pt cervix and notify with SVE, Dr. Henderson Cloud on her way to see pt

## 2012-09-09 NOTE — Progress Notes (Signed)
Dr. Henderson Cloud at bedside discussing with pt need for c-section due to non-reassuring FHR, discussing risks and benefits of C-section, pt verbalizes understanding and consents to C-section, FOB at bedside

## 2012-09-09 NOTE — Brief Op Note (Signed)
09/07/2012 - 09/09/2012  5:36 AM  PATIENT:  Felicia Shepard  32 y.o. female  PRE-OPERATIVE DIAGNOSIS:  Nonreassuring Fetal Heart Rate  POST-OPERATIVE DIAGNOSIS:  Nonreassuring Fetal Heart Rate  PROCEDURE:  Procedure(s): CESAREAN SECTION (N/A)  SURGEON:  Surgeon(s) and Role:    * Michelle A Horvath, MD - Primary   ANESTHESIA:   epidural  EBL:  Total I/O In: -  Out: 2300 [Urine:2300]  SPECIMEN:  Source of Specimen:  placenta  DISPOSITION OF SPECIMEN:  PATHOLOGY  COUNTS:  YES  TOURNIQUET:  * No tourniquets in log *  DICTATION: .Note written in EPIC  PLAN OF CARE: Admit to inpatient   PATIENT DISPOSITION:  PACU - hemodynamically stable.   Delay start of Pharmacological VTE agent (>24hrs) due to surgical blood loss or risk of bleeding: not applicable  Complications:  none Medications:  Ancef, Pitocin Findings:  Baby female, Apgars 1,7,9, cord gas 7.16.  The baby was initially floppy but responded quickly to stimulation and bag O2.  Baby to bedside vigorous now.  Normal tubes, ovaries and uterus seen.  Technique:  After adequate epidural anesthesia was achieved, the patient was prepped and draped in usual sterile fashion.  A foley catheter was used to drain the bladder.  A pfannanstiel incision was made with the scalpel and carried down to the fascia with the bovie cautery. The fascia was incised in the midline with the scalpel and carried in a transverse curvilinear manner bilaterally.  The fascia was reflected superiorly and inferiorly off the rectus muscles and the muscles split in the midline.  A bowel free portion of the peritoneum was entered bluntly and then extended in a superior and inferior manner with good visualization of the bowel and bladder.  The Alexis instrument was then placed and the vesico-uterine fascia tented up and incised in a transverse curvilinear manner.  A 2 cm transverse incision was made in the upper portion of the lower uterine segment until the amnion was  exposed.   The incision was extended transversely in a blunt manner.  Thick meconium fluid was noted and the baby delivered in the vertex presentation without complication.  The baby was bulb suctioned and the cord was clamped and cut.  The baby was then handed to awaiting Neonatology.  The placenta was then delivered manually and the uterus cleared of all debris.  The uterine incision was then closed with a running lock stitch of 0 monocryl.  An imbricating layer of 0 monocryl was closed as well. good hemostasis of the uterine incision was achieved and the abdomen was cleared with irrigation.  The peritoneum was closed with a running stitch of 2-0 vicryl.  This incorporated the rectus muscles as a separate layer.  The fascia was then closed with a running stitch of 0 vicryl.  The subcutaneous layer was closed with interrupted  stitches of 2-0 plain gut.  The skin was closed with a subcuticular 4-0 vicryl on keith and dermabond.  The patient tolerated the procedure well and was returned to the recovery room in stable condition.  All counts were correct times three.  HORVATH,MICHELLE A    

## 2012-09-09 NOTE — Progress Notes (Signed)
Dr. Rodman Pickle notified of pt's pain level even after 3 Epidural PCEA will come to redose pt

## 2012-09-09 NOTE — Anesthesia Postprocedure Evaluation (Signed)
  Anesthesia Post-op Note  Patient: Felicia Shepard  Procedure(s) Performed: Procedure(s) with comments: CESAREAN SECTION (N/A) - Primary Cesarean Section Delivery Baby Boy @ [phone], Apgars 1/7/9  Patient Location: Mother/Baby  Anesthesia Type:Epidural  Level of Consciousness: awake, alert  and oriented  Airway and Oxygen Therapy: Patient Spontanous Breathing  Post-op Pain: none  Post-op Assessment: Post-op Vital signs reviewed and Patient's Cardiovascular Status Stable  Post-op Vital Signs: Reviewed and stable  Complications: No apparent anesthesia complications

## 2012-09-09 NOTE — Progress Notes (Signed)
At about 1:30 am pt had a prolonged decel after multiple moderate to severe variables.  The FHTs then had several late decels but recovered and had only occasional moderate variables.  Now, FHTs baseline raised to 150-160s, gstv, some accels but now persistent late decels.   Ctxes 4 min SVE only up to 6/80 (thick)/-2  Protracted labor now with a nonreassuring FHT remote from vaginal delivery.  All R/B/Alt d/w pt and she agrees to proceed.

## 2012-09-09 NOTE — Op Note (Signed)
09/07/2012 - 09/09/2012  5:36 AM  PATIENT:  Felicia Shepard  32 y.o. female  PRE-OPERATIVE DIAGNOSIS:  Nonreassuring Fetal Heart Rate  POST-OPERATIVE DIAGNOSIS:  Nonreassuring Fetal Heart Rate  PROCEDURE:  Procedure(s): CESAREAN SECTION (N/A)  SURGEON:  Surgeon(s) and Role:    * Loney Laurence, MD - Primary   ANESTHESIA:   epidural  EBL:  Total I/O In: -  Out: 2300 [Urine:2300]  SPECIMEN:  Source of Specimen:  placenta  DISPOSITION OF SPECIMEN:  PATHOLOGY  COUNTS:  YES  TOURNIQUET:  * No tourniquets in log *  DICTATION: .Note written in EPIC  PLAN OF CARE: Admit to inpatient   PATIENT DISPOSITION:  PACU - hemodynamically stable.   Delay start of Pharmacological VTE agent (>24hrs) due to surgical blood loss or risk of bleeding: not applicable  Complications:  none Medications:  Ancef, Pitocin Findings:  Baby female, Apgars 1,7,9, cord gas 7.16.  The baby was initially floppy but responded quickly to stimulation and bag O2.  Baby to bedside vigorous now.  Normal tubes, ovaries and uterus seen.  Technique:  After adequate epidural anesthesia was achieved, the patient was prepped and draped in usual sterile fashion.  A foley catheter was used to drain the bladder.  A pfannanstiel incision was made with the scalpel and carried down to the fascia with the bovie cautery. The fascia was incised in the midline with the scalpel and carried in a transverse curvilinear manner bilaterally.  The fascia was reflected superiorly and inferiorly off the rectus muscles and the muscles split in the midline.  A bowel free portion of the peritoneum was entered bluntly and then extended in a superior and inferior manner with good visualization of the bowel and bladder.  The Alexis instrument was then placed and the vesico-uterine fascia tented up and incised in a transverse curvilinear manner.  A 2 cm transverse incision was made in the upper portion of the lower uterine segment until the amnion was  exposed.   The incision was extended transversely in a blunt manner.  Thick meconium fluid was noted and the baby delivered in the vertex presentation without complication.  The baby was bulb suctioned and the cord was clamped and cut.  The baby was then handed to awaiting Neonatology.  The placenta was then delivered manually and the uterus cleared of all debris.  The uterine incision was then closed with a running lock stitch of 0 monocryl.  An imbricating layer of 0 monocryl was closed as well. good hemostasis of the uterine incision was achieved and the abdomen was cleared with irrigation.  The peritoneum was closed with a running stitch of 2-0 vicryl.  This incorporated the rectus muscles as a separate layer.  The fascia was then closed with a running stitch of 0 vicryl.  The subcutaneous layer was closed with interrupted  stitches of 2-0 plain gut.  The skin was closed with a subcuticular 4-0 vicryl on keith and dermabond.  The patient tolerated the procedure well and was returned to the recovery room in stable condition.  All counts were correct times three.  HORVATH,MICHELLE A

## 2012-09-09 NOTE — Progress Notes (Signed)
Dr. Henderson Cloud notified of SVE, will come to discuss and consent pt for c-section due to non-reassuring fetal heart rate and lack of cervical change, RN to prep pt

## 2012-09-09 NOTE — Anesthesia Postprocedure Evaluation (Signed)
  Anesthesia Post-op Note  Anesthesia Post Note  Patient: Felicia Shepard  Procedure(s) Performed: Procedure(s) (LRB): CESAREAN SECTION (N/A)  Anesthesia type: Epidural  Patient location: PACU  Post pain: Pain level controlled  Post assessment: Post-op Vital signs reviewed  Last Vitals:  Filed Vitals:   09/09/12 0630  BP: 131/65  Pulse: 100  Temp:   Resp: 19    Post vital signs: stable  Level of consciousness: awake  Complications: No apparent anesthesia complications

## 2012-09-09 NOTE — Lactation Note (Signed)
This note was copied from the chart of Felicia Mariann Palo. Lactation Consultation Note  Patient Name: Felicia Shepard [id number] Date: 09/09/2012 Reason for consult: Initial assessment Baby lying next to Mom, not giving any feeding ques. Mom reports he had been sucking on his mouth but has now fallen back asleep. BF basics reviewed with Mom. Demonstrated hand expression, colostrum present with hand expression. Attempted to wake baby to nurse but he was too sleepy. Mom concerned about low blood sugars and need to supplement. Baby had formula per MD order after repeated low blood sugars not resolved with STS or attempts to nurse. Reassured Mom of necessity to stabilize blood sugar. Advised Mom to breast feed with feeding ques, if she does not observe feeding ques by 3 hours from last attempt or feeding, then place baby STS and attempt to breast feed. Advised Mom to keep baby STS when she is awake as normally this will help stabilize blood sugar. Lactation brochure left for Mom's review. Advised of OP services and support group. Advised to call for assist with feedings.   Maternal Data Formula Feeding for Exclusion: No Infant to breast within first hour of birth: No Breastfeeding delayed due to:: Maternal status Has patient been taught Hand Expression?: Yes Does the patient have breastfeeding experience prior to this delivery?: No  Feeding Feeding Type: Breast Fed Feeding method: Breast Length of feed: 0 min  LATCH Score/Interventions Latch: Too sleepy or reluctant, no latch achieved, no sucking elicited. Intervention(s): Adjust position;Assist with latch;Breast massage;Breast compression  Audible Swallowing: None  Type of Nipple: Everted at rest and after stimulation  Comfort (Breast/Nipple): Soft / non-tender     Hold (Positioning): Assistance needed to correctly position infant at breast and maintain latch.  LATCH Score: 5  Lactation Tools Discussed/Used     Consult Status Consult  Status: Follow-up Date: 09/09/12 Follow-up type: In-patient    Alfred Levins 09/09/2012, 3:06 PM

## 2012-09-09 NOTE — Anesthesia Postprocedure Evaluation (Signed)
  Anesthesia Post-op Note  Patient: Felicia Shepard  Procedure(s) Performed: Procedure(s) with comments: CESAREAN SECTION (N/A) - Primary Cesarean Section Delivery Baby Boy @ [phone], Apgars 1/7/9  Patient Location: PACU and Mother/Baby  Anesthesia Type:Spinal  Level of Consciousness: awake, alert  and oriented  Airway and Oxygen Therapy: Patient Spontanous Breathing  Post-op Pain: none  Post-op Assessment: Post-op Vital signs reviewed  Post-op Vital Signs: Reviewed and stable  Complications: No apparent anesthesia complications

## 2012-09-09 NOTE — Transfer of Care (Signed)
Immediate Anesthesia Transfer of Care Note  Patient: Felicia Shepard  Procedure(s) Performed: Procedure(s) with comments: CESAREAN SECTION (N/A) - Primary Cesarean Section Delivery Baby Boy @ [phone], Apgars 1/7/9  Patient Location: PACU  Anesthesia Type:Epidural  Level of Consciousness: awake, alert , oriented and patient cooperative  Airway & Oxygen Therapy: Patient Spontanous Breathing  Post-op Assessment: Report given to PACU RN, Post -op Vital signs reviewed and stable and Patient moving all extremities X 4  Post vital signs: Reviewed and stable  Complications: No apparent anesthesia complications

## 2012-09-10 ENCOUNTER — Encounter (HOSPITAL_COMMUNITY): Payer: Self-pay | Admitting: Obstetrics and Gynecology

## 2012-09-10 LAB — CBC
MCH: 29.2 pg (ref 26.0–34.0)
MCV: 86.5 fL (ref 78.0–100.0)
Platelets: 103 10*3/uL — ABNORMAL LOW (ref 150–400)
RDW: 13.4 % (ref 11.5–15.5)

## 2012-09-10 NOTE — Progress Notes (Signed)
Parents decline to have circ done today since he got his shots and they are working on breast feeding.

## 2012-09-10 NOTE — Progress Notes (Signed)
  Patient is eating, ambulating, voiding.  Pain control is good.  Filed Vitals:   09/09/12 1716 09/09/12 2118 09/10/12 0153 09/10/12 0530  BP: 132/79 125/81 139/68 120/69  Pulse: 81 87 94 90  Temp:  98.1 F (36.7 C) 98.6 F (37 C) 97.5 F (36.4 C)  TempSrc:  Axillary    Resp: 16 16 18 18   Height:      Weight:      SpO2:   98% 99%    lungs:   clear to auscultation cor:    RRR Abdomen:  soft, appropriate tenderness, incisions intact and without erythema or exudate ex:    no cords   Lab Results  Component Value Date   WBC 15.9* 09/10/2012   HGB 9.3* 09/10/2012   HCT 27.5* 09/10/2012   MCV 86.5 09/10/2012   PLT 103* 09/10/2012    --/--/A POS (03/04 2305)/RI  A/P    Post operative day 1.  Routine post op and postpartum care.  Expect d/c tomorrow.  Percocet for pain control. Parents desire circ; all risks d/w them.

## 2012-09-11 MED ORDER — OXYCODONE-ACETAMINOPHEN 5-325 MG PO TABS: 1.0000 | ORAL_TABLET | ORAL | Status: DC | PRN

## 2012-09-11 NOTE — Discharge Summary (Signed)
Obstetric Discharge Summary Reason for Admission: rupture of membranes Prenatal Procedures: none Intrapartum Procedures: cesarean: low cervical, transverse for NRFHT Postpartum Procedures: none Complications-Operative and Postpartum: none Hemoglobin  Date Value Range Status  09/10/2012 9.3* 12.0 - 15.0 g/dL Final     REPEATED TO VERIFY     DELTA CHECK NOTED     HCT  Date Value Range Status  09/10/2012 27.5* 36.0 - 46.0 % Final    Physical Exam:  General: alert and cooperative Lochia: appropriate Uterine Fundus: firm Incision: healing well, no significant drainage, no significant erythema DVT Evaluation: No evidence of DVT seen on physical exam.  Discharge Diagnoses: Term Pregnancy-delivered  Discharge Information: Date: 09/11/2012 Activity: pelvic rest Diet: routine Medications: PNV, Ibuprofen and Percocet Condition: stable Instructions: refer to practice specific booklet Discharge to: home Follow-up Information   Follow up with HORVATH,MICHELLE A, MD In 2 weeks.   Contact information:   [address] Dorothyann [place] [address] [phone]       Newborn Data: Live born female  Birth Weight: 8 lb 14.3 oz (4035 g) APGAR: 1, 7  Home with mother.  Philip Aspen 09/11/2012, 2:23 PM

## 2012-09-23 ENCOUNTER — Telehealth (HOSPITAL_COMMUNITY): Payer: Self-pay | Admitting: *Deleted

## 2012-09-23 NOTE — Telephone Encounter (Signed)
Resolve episode 

## 2012-10-27 ENCOUNTER — Other Ambulatory Visit: Payer: Self-pay | Admitting: Obstetrics and Gynecology

## 2013-02-02 ENCOUNTER — Ambulatory Visit (INDEPENDENT_AMBULATORY_CARE_PROVIDER_SITE_OTHER): Payer: BC Managed Care – PPO | Admitting: Internal Medicine

## 2013-02-02 VITALS — BP 118/66 | HR 98 | Temp 98.1°F | Resp 18 | Ht 64.75 in | Wt 188.0 lb

## 2013-02-02 DIAGNOSIS — J329 Chronic sinusitis, unspecified: Secondary | ICD-10-CM

## 2013-02-02 MED ORDER — AMOXICILLIN 875 MG PO TABS: 875.0000 mg | ORAL_TABLET | Freq: Two times a day (BID) | ORAL | Status: DC

## 2013-02-02 NOTE — Progress Notes (Signed)
  Subjective:    Patient ID: Felicia Shepard, female    DOB: 02/08/1981, 32 y.o.   MRN: [id number]  HPI  32 YO female patient here today with sinus pressure and nasal congestion. She states the symptoms started 2 weeks ago with sinus pressure and tenderness around her eyes. She states she had a fever a few days ago- 100.9 at the worst. She took Advil. She has tried a sinus rinse and feels like the water went to her ears and they felt clogged. She does not have any ear pain today. She denies a cough or st. Pt states she has to blow her nose frequently and the discharge is green and thick.   She has only tried Afrin for two days. She is still breast feeding her 75 mo old son. She is not sure what she is able to take. She will use ibuprofen for her sinus pressure.   Review of Systems     Objective:   Physical Exam BP 118/66  Pulse 98  Temp(Src) 98.1 F (36.7 C) (Oral)  Resp 18  Ht 5' 4.75" (1.645 m)  Wt 188 lb (85.276 kg)  BMI 31.51 kg/m2  SpO2 99%  LMP 12/21/2012  Breastfeeding? Yes Pupils equal round reactive to light and accommodation TMs clear Nares with purulent discharge Tender maxillary areas to percussion bilaterally Throat clear No nodes       Assessment & Plan:  Sinusitis following URI  Amoxicillin 875 twice a day for 10 days She reported this as an allergy because she had a rash with this drug during mononucleosis She had taken it many times before with success prior to that occasion so we decided it would be the best agent to try with a breast-feeding mother and she is instructed to call immediately if she has any set a rash

## 2013-05-18 ENCOUNTER — Ambulatory Visit (INDEPENDENT_AMBULATORY_CARE_PROVIDER_SITE_OTHER): Payer: BC Managed Care – PPO | Admitting: Internal Medicine

## 2013-05-18 ENCOUNTER — Ambulatory Visit: Payer: BC Managed Care – PPO | Admitting: Internal Medicine

## 2013-05-18 ENCOUNTER — Encounter: Payer: Self-pay | Admitting: Internal Medicine

## 2013-05-18 VITALS — BP 116/84 | HR 101 | Temp 97.7°F | Wt 183.0 lb

## 2013-05-18 DIAGNOSIS — Z23 Encounter for immunization: Secondary | ICD-10-CM

## 2013-05-18 DIAGNOSIS — J069 Acute upper respiratory infection, unspecified: Secondary | ICD-10-CM

## 2013-05-18 DIAGNOSIS — J329 Chronic sinusitis, unspecified: Secondary | ICD-10-CM

## 2013-05-18 NOTE — Progress Notes (Signed)
Chief Complaint  Patient presents with  . Nasal Congestion    Started Saturday.  Cough is productive of a green sputum.  Treating with Advil.  . Sore Throat  . Cough  . Headache  . Generalized Body Aches  . Chills    HPI:  Patient is reestablishing after absence for a few years. She did just have childbirth. She is generally been well but over the last 4-5 days has had the above symptoms. She has an 84-month-old at home. Has no history of chronic lung disease but has some discolored phlegm at times no chest pain shortness of breath or active wheezing has thick nasal congestion.  Croupy symptoms at home.  ROS: See pertinent positives and negatives per HPI.  Negative for chest pain shortness of breath change in vision hearing loss nausea vomiting diarrhea.  She has been to the nutritionist in the past and had some blood tests that showed some food sensitivities she has changed her diet and her GI distress that she had years ago is much better. She did have a cholecystectomy in 2008.  She was seen in urgent care in the summer for sinus infection and given amoxicillin. It apparently helped her problems.  Asks if she can have a flu shot.  Past Medical History  Diagnosis Date  . Abnormal Pap smear   . Hx of varicella     Family History  Problem Relation Age of Onset  . Cancer Mother     leaukemia  . Hypertension Father   . Autism Brother   . Hypertension Maternal Grandmother   . Heart disease Maternal Grandfather   . Diabetes Maternal Grandfather   . Heart disease Paternal Grandmother   . Hypertension Paternal Grandmother   . Diabetes Paternal Grandmother     History   Social History  . Marital Status: Married    Spouse Name: N/A    Number of Children: N/A  . Years of Education: N/A   Social History Main Topics  . Smoking status: Never Smoker   . Smokeless tobacco: None  . Alcohol Use: No  . Drug Use: No  . Sexual Activity: Yes   Other Topics Concern  . None    Social History Narrative   Usually receives 8 hours of sleep per night   3 people living in the home.   No animals in the home.   College degree married employed Automotive engineer   Gravida 1 para 1   Social alcohol no tobacco local learning and home.    Outpatient Encounter Prescriptions as of 05/18/2013  Medication Sig  . FENUGREEK PO Take by mouth.  . Prenatal Vit-Fe Fumarate-FA (PRENATAL MULTIVITAMIN) TABS Take 1 tablet by mouth daily.  . Probiotic Product (PROBIOTIC DAILY PO) Take by mouth.  . [DISCONTINUED] amoxicillin (AMOXIL) 875 MG tablet Take 1 tablet (875 mg total) by mouth 2 (two) times daily.  . [DISCONTINUED] famotidine (PEPCID) 20 MG tablet Take 20 mg by mouth 2 (two) times daily. heartburn  . [DISCONTINUED] oxyCODONE-acetaminophen (PERCOCET/ROXICET) 5-325 MG per tablet Take 1-2 tablets by mouth every 4 (four) hours as needed.    EXAM:  BP 116/84  Pulse 101  Temp(Src) 97.7 F (36.5 C) (Oral)  Wt 183 lb (83.008 kg)  SpO2 98%  Body mass index is 30.68 kg/(m^2). WDWN in NAD  quiet respirations; mildly congested  somewhat hoarse. Non toxic . HEENT: Normocephalic ;atraumatic , Eyes;  PERRL, EOMs  Full, lids and conjunctiva clear,,Ears: no deformities, canals nl, TM landmarks  normal, Nose: no deformity or discharge but congested;face minimally tender Mouth : OP clear without lesion or edema . Neck: Supple without adenopathy or masses or bruits Chest:  Clear to A&P without wheezes rales or rhonchi CV:  S1-S2 no gallops or murmurs peripheral perfusion is normal Skin :nl perfusion and no acute rashes  MS: moves all extremities without noticeable focal  abnormality PSYCH: pleasant and cooperative, no obvious depression or anxiety  ASSESSMENT AND PLAN:  Discussed the following assessment and plan:  Acute upper respiratory infection of multiple sites - call if persistent and sinusitis sx   Need for prophylactic vaccination and inoculation against influenza - Plan:  Flu Vaccine QUAD 36+ mos PF IM (Fluarix) Discussed risk benefit of antibiotics early. If her sinusitis symptoms persist progress antibiotics might be helpful. -Patient advised to return or notify health care team  if symptoms worsen or persist or new concerns arise.  Patient Instructions  You have a viral  Rep infection thatr we are seeing in the community. Lungs are clear .  Should  See improvement a week  cough could last 2-3 weeks .  Contact us if  persistent or progressive pain etc.  Ok for flu vaccine     Neta Mends. Kamori Barbier M.D.

## 2013-05-18 NOTE — Patient Instructions (Signed)
You have a viral  Rep infection thatr we are seeing in the community. Lungs are clear .  Should  See improvement a week  cough could last 2-3 weeks .  Contact us if  persistent or progressive pain etc.  Ok for flu vaccine

## 2013-05-24 ENCOUNTER — Other Ambulatory Visit: Payer: Self-pay | Admitting: Family Medicine

## 2013-05-24 ENCOUNTER — Telehealth: Payer: Self-pay | Admitting: Internal Medicine

## 2013-05-24 ENCOUNTER — Encounter: Payer: Self-pay | Admitting: Internal Medicine

## 2013-05-24 DIAGNOSIS — J069 Acute upper respiratory infection, unspecified: Secondary | ICD-10-CM | POA: Insufficient documentation

## 2013-05-24 MED ORDER — AMOXICILLIN 500 MG PO CAPS: 500.0000 mg | ORAL_CAPSULE | Freq: Three times a day (TID) | ORAL | Status: DC

## 2013-05-24 NOTE — Telephone Encounter (Signed)
Spoke to the pt and she said she can take amoxicillian.  Sent to the pharmacy by e-scribe.

## 2013-05-24 NOTE — Telephone Encounter (Signed)
Left message on home/cell for the pt to return my call. 

## 2013-05-24 NOTE — Telephone Encounter (Signed)
Patient Information:  Caller Name: Phala  Phone: [phone]  Patient: Felicia Shepard, Felicia Shepard  Gender: Female  DOB: December 22, 1980  Age: 32 Years  PCP: Berniece Andreas [facility])  Pregnant: No  Office Follow Up:  Does the office need to follow up with this patient?: Yes  Instructions For The Office: Requesting antibiotic  RN Note:  Breast feeding.  Prefers not to schedule appointment since MD told her  at 05/18/13 office visit she would feel comfortable calling in antibiotic if symptoms persist.  [facility]. Please call to advise if Rx ordered or MD wants return office visit.  Symptoms  Reason For Call & Symptoms: Called about persistent symptoms with green eye discharge, productive cough, and green nasal discharge.  Also reports mild sinus discomfort and pain in teeth.  Reviewed Health History In EMR: Yes  Reviewed Medications In EMR: Yes  Reviewed Allergies In EMR: Yes  Reviewed Surgeries / Procedures: Yes  Date of Onset of Symptoms: 05/14/2013  Treatments Tried: Hydrate and humidify  Treatments Tried Worked: No OB / GYN:  LMP: 05/14/2013  Guideline(s) Used:  Sinus Pain and Congestion  Disposition Per Guideline:   See Today or Tomorrow in Office  Reason For Disposition Reached:   Nasal discharge present > 10 days  Advice Given:  Reassurance:   Sinus congestion is a normal part of a cold.  Antibiotics are not helpful for the sinus congestion that occurs with colds.  For a Runny Nose With Profuse Discharge:  Nasal mucus and discharge helps to wash viruses and bacteria out of the nose and sinuses.  Blowing the nose is all that is needed.  For a Stuffy Nose - Use Nasal Washes:  Introduction: Saline (salt water) nasal irrigation (nasal wash) is an effective and simple home remedy for treating stuffy nose and sinus congestion. The nose can be irrigated by pouring, spraying, or squirting salt water into the nose and then letting it run back out.  Medicines for a Stuffy or  Runny Nose:  Most cold medicines that are available over-the-counter (OTC) are not helpful.  Pain and Fever Medicines:  For pain or fever relief, take either acetaminophen or ibuprofen.  Hydration:  Drink plenty of liquids (6-8 glasses of water daily). If the air in your home is dry, use a cool mist humidifier  Expected Course:  Sinus congestion from viral upper respiratory infections (colds) usually lasts 5-10 days.  Occasionally a cold can worsen and turn into bacterial sinusitis. Clues to this are sinus symptoms lasting longer than 10 days, fever lasting longer than 3 days, and worsening pain. Bacterial sinusitis may need antibiotic treatment.  Call Back If:   Sinus pain lasts longer than 1 day after starting treatment using nasal washes  Sinus congestion (fullness) lasts longer than 10 days  Fever lasts longer than 3 days  You become worse.  RN Overrode Recommendation:  Patient Requests Prescription  Declined appointment; requesting antibiotic.

## 2013-05-24 NOTE — Telephone Encounter (Signed)
Patient returned your call, please call her back at [phone]

## 2013-05-24 NOTE — Telephone Encounter (Signed)
amox 500 tid for 10 days  Please confirm that she can take amoxicillin as pcn is listed on her allergy sheet.

## 2013-07-29 ENCOUNTER — Ambulatory Visit (INDEPENDENT_AMBULATORY_CARE_PROVIDER_SITE_OTHER): Payer: BC Managed Care – PPO | Admitting: Family

## 2013-07-29 ENCOUNTER — Encounter: Payer: Self-pay | Admitting: Family

## 2013-07-29 VITALS — BP 112/68 | HR 114 | Wt 183.0 lb

## 2013-07-29 DIAGNOSIS — M25522 Pain in left elbow: Secondary | ICD-10-CM

## 2013-07-29 DIAGNOSIS — M7032 Other bursitis of elbow, left elbow: Secondary | ICD-10-CM

## 2013-07-29 DIAGNOSIS — M7710 Lateral epicondylitis, unspecified elbow: Secondary | ICD-10-CM

## 2013-07-29 DIAGNOSIS — M25529 Pain in unspecified elbow: Secondary | ICD-10-CM

## 2013-07-29 MED ORDER — KETOROLAC TROMETHAMINE 60 MG/2ML IM SOLN
60.0000 mg | Freq: Once | INTRAMUSCULAR | Status: AC
  Administered 2013-07-29: 60 mg via INTRAMUSCULAR

## 2013-07-29 MED ORDER — METHYLPREDNISOLONE 4 MG PO KIT: PACK | ORAL | Status: AC

## 2013-07-29 MED ORDER — KETOROLAC TROMETHAMINE 60 MG/2ML IM SOLN: 60.0000 mg | Freq: Once | INTRAMUSCULAR | Status: DC

## 2013-07-29 NOTE — Patient Instructions (Signed)
Bursitis Bursitis is a swelling and soreness (inflammation) of a fluid-filled sac (bursa) that overlies and protects a joint. It can be caused by injury, overuse of the joint, arthritis or infection. The joints most likely to be affected are the elbows, shoulders, hips and knees. HOME CARE INSTRUCTIONS   Apply ice to the affected area for 15-20 minutes each hour while awake for 2 days. Put the ice in a plastic bag and place a towel between the bag of ice and your skin.  Rest the injured joint as much as possible, but continue to put the joint through a full range of motion, 4 times per day. (The shoulder joint especially becomes rapidly "frozen" if not used.) When the pain lessens, begin normal slow movements and usual activities.  Only take over-the-counter or prescription medicines for pain, discomfort or fever as directed by your caregiver.  Your caregiver may recommend draining the bursa and injecting medicine into the bursa. This may help the healing process.  Follow all instructions for follow-up with your caregiver. This includes any orthopedic referrals, physical therapy and rehabilitation. Any delay in obtaining necessary care could result in a delay or failure of the bursitis to heal and chronic pain. SEEK IMMEDIATE MEDICAL CARE IF:   Your pain increases even during treatment.  You develop an oral temperature above 102 F (38.9 C) and have heat and inflammation over the involved bursa. MAKE SURE YOU:   Understand these instructions.  Will watch your condition.  Will get help right away if you are not doing well or get worse. Document Released: 06/21/2000 Document Revised: 09/16/2011 Document Reviewed: 05/26/2009 Exit[employer] Patient Information 2014 [employer].  

## 2013-07-29 NOTE — Addendum Note (Signed)
Addended by: Beverely LowFRAZIER, Paxtyn Boyar L on: 07/29/2013 11:10 AM   Modules accepted: Orders

## 2013-07-29 NOTE — Progress Notes (Signed)
Subjective:    Patient ID: Felicia Shepard, female    DOB: August 10, 1980, 33 y.o.   MRN: [id number]  HPI 33 year old white female, nonsmoker, patient of Dr. Fabian SharpPanosh is in today for complaints of left elbow pain and swelling x2 days and worsening. She had taken ibuprofen that helped initially but symptoms relapsed. Reports that she has joint swelling related to an in take of my cards and sugars. She's been referred to rheumatology and orthopedics in the past with benign findings. She's been worked up for rheumatoid arthritis and gout that have both been negative. Reports that she types a lot and has an 6281-month-old she carries. Denies any injury.   Review of Systems  Constitutional: Negative.   Respiratory: Negative.   Cardiovascular: Negative.   Musculoskeletal: Positive for arthralgias and joint swelling.       Left elbow  Skin: Negative.   Psychiatric/Behavioral: Negative.        Past Medical History  Diagnosis Date  . Abnormal Pap smear   . Hx of varicella     History   Social History  . Marital Status: Married    Spouse Name: N/A    Number of Children: N/A  . Years of Education: N/A   Occupational History  . Not on file.   Social History Main Topics  . Smoking status: Never Smoker   . Smokeless tobacco: Not on file  . Alcohol Use: No  . Drug Use: No  . Sexual Activity: Yes   Other Topics Concern  . Not on file   Social History Narrative   Usually receives 8 hours of sleep per night   3 people living in the home.   No animals in the home.   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    Past Surgical History  Procedure Laterality Date  . Cholecystectomy    . Cholecystectomy  2008  . Colposcopy    . No past surgeries    . Cesarean section N/A 09/09/2012    Procedure: CESAREAN SECTION;  Surgeon: Loney LaurenceMichelle A Horvath, MD;  Location: [facility];  Service: Obstetrics;  Laterality: N/A;   Primary Cesarean Section Delivery Baby Boy @ [phone], Apgars 1/7/9    Family History  Problem Relation Age of Onset  . Cancer Mother     leaukemia  . Hypertension Father   . Autism Brother   . Hypertension Maternal Grandmother   . Heart disease Maternal Grandfather   . Diabetes Maternal Grandfather   . Heart disease Paternal Grandmother   . Hypertension Paternal Grandmother   . Diabetes Paternal Grandmother     Allergies  Allergen Reactions  . Penicillins Rash    REACTION: rash- One time reaction when dx with Mono Can take amoxicillin    Current Outpatient Prescriptions on File Prior to Visit  Medication Sig Dispense Refill  . FENUGREEK PO Take by mouth.      . Prenatal Vit-Fe Fumarate-FA (PRENATAL MULTIVITAMIN) TABS Take 1 tablet by mouth daily.      . Probiotic Product (PROBIOTIC DAILY PO) Take by mouth.      Marland Kitchen. amoxicillin (AMOXIL) 500 MG capsule Take 1 capsule (500 mg total) by mouth 3 (three) times daily.  30 capsule  0   No current facility-administered medications on file prior to visit.    BP 112/68  Pulse 114  Wt 183 lb (83.008 kg)chart Objective:   Physical Exam  Constitutional: She is oriented to person, place, and time. She appears well-developed and well-nourished.  Neck: Normal range of motion. Neck supple.  Cardiovascular: Normal rate, regular rhythm and normal heart sounds.   Pulmonary/Chest: Breath sounds normal.  Musculoskeletal: She exhibits edema and tenderness.  Left elbow: Edema noted to the the lateral epicondyle-mild. Tender to palpation. No redness.  Neurological: She is alert and oriented to person, place, and time.  Skin: Skin is warm and dry.  Psychiatric: She has a normal mood and affect.     Toradol 60 mg IM x1 given     Assessment & Plan:  Assessment: 1. left elbow bursitis-Medrol Dosepak as directed. Continue oxycodone as needed for pain. Ice to the affected area. Patient on the opposite symptoms worsen or persist. Recheck as  scheduled, and as needed.

## 2013-10-28 ENCOUNTER — Other Ambulatory Visit: Payer: Self-pay | Admitting: Obstetrics and Gynecology

## 2014-04-06 ENCOUNTER — Other Ambulatory Visit: Payer: Self-pay | Admitting: Obstetrics and Gynecology

## 2014-05-09 ENCOUNTER — Encounter: Payer: Self-pay | Admitting: Family

## 2014-05-13 ENCOUNTER — Ambulatory Visit (INDEPENDENT_AMBULATORY_CARE_PROVIDER_SITE_OTHER)
Admission: RE | Admit: 2014-05-13 | Discharge: 2014-05-13 | Disposition: A | Discharge status: Home / Self Care | Payer: BC Managed Care – PPO | Source: Ambulatory Visit | Attending: Internal Medicine | Admitting: Internal Medicine

## 2014-05-13 ENCOUNTER — Encounter: Payer: Self-pay | Admitting: Internal Medicine

## 2014-05-13 ENCOUNTER — Ambulatory Visit (INDEPENDENT_AMBULATORY_CARE_PROVIDER_SITE_OTHER): Payer: BC Managed Care – PPO | Admitting: Internal Medicine

## 2014-05-13 ENCOUNTER — Other Ambulatory Visit: Payer: Self-pay | Admitting: Family Medicine

## 2014-05-13 VITALS — BP 130/86 | HR 100 | Temp 98.3°F | Ht 65.0 in | Wt 177.1 lb

## 2014-05-13 DIAGNOSIS — S6992XA Unspecified injury of left wrist, hand and finger(s), initial encounter: Secondary | ICD-10-CM

## 2014-05-13 DIAGNOSIS — R05 Cough: Secondary | ICD-10-CM

## 2014-05-13 DIAGNOSIS — R079 Chest pain, unspecified: Secondary | ICD-10-CM

## 2014-05-13 DIAGNOSIS — R053 Chronic cough: Secondary | ICD-10-CM

## 2014-05-13 DIAGNOSIS — S62609A Fracture of unspecified phalanx of unspecified finger, initial encounter for closed fracture: Secondary | ICD-10-CM

## 2014-05-13 MED ORDER — HYDROCODONE-HOMATROPINE 5-1.5 MG/5ML PO SYRP: 5.0000 mL | ORAL_SOLUTION | ORAL | Status: DC | PRN

## 2014-05-13 MED ORDER — AZITHROMYCIN 250 MG PO TABS: 250.0000 mg | ORAL_TABLET | ORAL | Status: DC

## 2014-05-13 NOTE — Patient Instructions (Addendum)
Because of  The worse sx on the left get x ray  Today  Cough med  For comfort .  If needed . Rest fluids .  May add antibiotic if getting worse.  Or x ray abnormal. X ray middle finger may be ligament strain   Buddy tape if needed  Will let you know of xr ay when available

## 2014-05-13 NOTE — Progress Notes (Signed)
Pre visit review using our clinic review tool, if applicable. No additional management support is needed unless otherwise documented below in the visit note.   Chief Complaint  Patient presents with  . Cough    X1.5WKS  . Shortness of Breath  . Wheezing  . Nasal Congestion    HPI: Felicia Shepard  33 y.o.comes in today for an acute visit. Onset for about 1-1/2 weeks of cough with mild upper respiratory congestion and normal energy until this morning. f when she noted her cough was worse and felt discomfort on the left upper side of her chest like something was different  Unilateral chest discomfort no pleurisy. Little short of breath . Different kind of cough and feelingtoday .no fever and chills no hemoptysis has phlegm that is small amounts of green. No major sinus pain and drainage her Mucinex over-the-counter occasionally has some Sudafed. Child is in daycare but hasn't really been sick except for a cold.  In addition check left hand slipped on a hardwood floor about 2 months ago and left middle finger jammed into a bent had swelling and pain used buddy taping and time is still a bit swollen and slightly tender. Asks advice. Doesn't feel unstable. ROS: See pertinent positives and negatives per HPI. mirena Past Medical History  Diagnosis Date  . Abnormal Pap smear   . Hx of varicella     Family History  Problem Relation Age of Onset  . Cancer Mother     leaukemia  . Hypertension Father   . Autism Brother   . Hypertension Maternal Grandmother   . Heart disease Maternal Grandfather   . Diabetes Maternal Grandfather   . Heart disease Paternal Grandmother   . Hypertension Paternal Grandmother   . Diabetes Paternal Grandmother     History   Social History  . Marital Status: Married    Spouse Name: N/A    Number of Children: N/A  . Years of Education: N/A   Social History Main Topics  . Smoking status: Never Smoker   . Smokeless tobacco: None  . Alcohol Use: No  . Drug  Use: No  . Sexual Activity: Yes   Other Topics Concern  . None   Social History Narrative   Usually receives 8 hours of sleep per night   3 people living in the home.   No animals in the home.   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    Outpatient Encounter Prescriptions as of 05/13/2014  Medication Sig  . Prenatal Vit-Fe Fumarate-FA (PRENATAL MULTIVITAMIN) TABS Take 1 tablet by mouth daily.  . Probiotic Product (PROBIOTIC DAILY PO) Take by mouth.  Marland Kitchen. azithromycin (ZITHROMAX Z-PAK) 250 MG tablet Take 1 tablet (250 mg total) by mouth as directed. Take 2 po first day, then 1 po qd  . HYDROcodone-homatropine (HYCODAN) 5-1.5 MG/5ML syrup Take 5 mLs by mouth every 4 (four) hours as needed for cough. 1-2 tsp  . [DISCONTINUED] amoxicillin (AMOXIL) 500 MG capsule Take 1 capsule (500 mg total) by mouth 3 (three) times daily.  . [DISCONTINUED] FENUGREEK PO Take by mouth.    EXAM:  BP 130/86 mmHg  Pulse 100  Temp(Src) 98.3 F (36.8 C) (Oral)  Ht 5\' 5"  (1.651 m)  Wt 177 lb 1.6 oz (80.332 kg)  BMI 29.47 kg/m2  SpO2 98%  LMP 04/16/2014 (Approximate)  Body mass index is 29.47 kg/(m^2).  GENERAL: vitals reviewed  and listed above, alert, oriented, appears well hydrated and in no acute distresswith deep severe bronchial cough frequently during the exam and visit. Nontoxic appearing HEENT: atraumatic, conjunctiva  clear, no obvious abnormalities on inspection of external nose and ears nares minimal congestion throat minimal redness cobblestoningOP : no lesion edema or exudate  NECK: no obvious masses on inspection palpation  LUNGS: breath sounds question equal slightly coarse on the left lower lateral area but no decreased breath sounds no rales no active wheezing CV: HRRR, no clubbing cyanosis or  peripheral edema nl cap refill  MS: left hand prominent PIP joint middle finger station range of motion finger  normal except slightly stiff on flexion. Minimally tender appears to be stable lateral movement. PSYCH: pleasant and cooperative, no obvious depression or anxiety  ASSESSMENT AND PLAN:  Discussed the following assessment and plan:  Cough, persistent - Plan: DG Chest 2 View  Left sided chest pain - Plan: DG Chest 2 View  Finger injury, left, initial encounter - Plan: DG Finger Middle Left Scenario sounds like viral respiratory infection however worsening after week and a half and with unilateral symptoms before the weekend concern about secondary infection. Get chest x-ray cough medicine given if wishes can add antibiotics if continues to progress or abnormal x-ray.  Probably had a grade 2 strain of her collateral ligament function appears pretty normal but joint protection when needed if progressing pain swelling may see a hand person will let her know about x-ray today -Patient advised to return or notify health care team  if symptoms worsen ,persist or new concerns arise.  Patient Instructions  Because of  The worse sx on the left get x ray  Today  Cough med  For comfort .  If needed . Rest fluids .  May add antibiotic if getting worse.  Or x ray abnormal. X ray middle finger may be ligament strain   Buddy tape if needed  Will let you know of xr ay when available      Burna MortimerWanda K. Panosh M.D.

## 2014-10-09 ENCOUNTER — Telehealth: Payer: Self-pay | Admitting: Family

## 2014-10-09 DIAGNOSIS — R6889 Other general symptoms and signs: Secondary | ICD-10-CM

## 2014-10-09 NOTE — Progress Notes (Signed)
E visit for Flu like symptoms   We are sorry that you are not feeling well.  Here is how we plan to help! Based on what you have shared with me it looks like you may have a respiratory virus that may be influenza.  Influenza or "the flu" is   an infection caused by a respiratory virus. The flu virus is highly contagious and persons who did not receive their yearly flu vaccination may "catch" the flu from close contact.  We have anti-viral medications to treat the viruses that cause this infection. They are not a "cure" and only shorten the course of the infection. These prescriptions are most effective when they are given within the first 2 days of "flu" symptoms. Antiviral medication are indicated if you have a high risk of complications from the flu. You should  also consider an antiviral medication if you are in close contact with someone who is at risk. These medications can help patients avoid complications from the flu  but have side effects that you should know. Possible side effects from Tamiflu or oseltamivir include nausea, vomiting, diarrhea, dizziness, headaches, eye redness, sleep problems or other respiratory symptoms. You should not take Tamiflu if you have an allergy to oseltamivir or any to the ingredients in Tamiflu.  Based upon your symptoms and potential risk factors I recommend that you follow the flu symptoms recommendation that I have listed below.  ANYONE WHO HAS FLU SYMPTOMS SHOULD: . Stay home. The flu is highly contagious and going out or to work exposes others! . Be sure to drink plenty of fluids. Water is fine as well as fruit juices, sodas and electrolyte beverages. You may want to stay away from caffeine or alcohol. If you are nauseated, try taking small sips of liquids. How do you know if you are getting enough fluid? Your urine should be a pale yellow or almost colorless. . Get rest. . Taking a steamy shower or using a humidifier may help nasal congestion and ease  sore throat pain. Using a saline nasal spray works much the same way. . Cough drops, hard candies and sore throat lozenges may ease your cough. . Line up a caregiver. Have someone check on you regularly.   GET HELP RIGHT AWAY IF: . You cannot keep down liquids or your medications. . You become short of breath . Your fell like you are going to pass out or loose consciousness. . Your symptoms persist after you have completed your treatment plan MAKE SURE YOU   Understand these instructions.  Will watch your condition.  Will get help right away if you are not doing well or get worse.  Your e-visit answers were reviewed by a board certified advanced clinical practitioner to complete your personal care plan.  Depending on the condition, your plan could have included both over the counter or prescription medications.  If there is a problem please reply  once you have received a response from your provider.  Your safety is important to us.  If you have drug allergies check your prescription carefully.    You can use MyChart to ask questions about today's visit, request a non-urgent call back, or ask for a work or school excuse.  You will get an e-mail in the next two days asking about your experience.  I hope that your e-visit has been valuable and will speed your recovery. Thank you for using e-visits.

## 2014-10-12 ENCOUNTER — Encounter: Payer: Self-pay | Admitting: Adult Health

## 2014-10-12 ENCOUNTER — Ambulatory Visit (INDEPENDENT_AMBULATORY_CARE_PROVIDER_SITE_OTHER): Payer: BLUE CROSS/BLUE SHIELD | Admitting: Adult Health

## 2014-10-12 VITALS — BP 100/70 | HR 113 | Temp 98.6°F | Wt 181.4 lb

## 2014-10-12 DIAGNOSIS — R05 Cough: Secondary | ICD-10-CM

## 2014-10-12 DIAGNOSIS — R059 Cough, unspecified: Secondary | ICD-10-CM

## 2014-10-12 DIAGNOSIS — R509 Fever, unspecified: Secondary | ICD-10-CM

## 2014-10-12 LAB — POCT INFLUENZA A/B
INFLUENZA A, POC: NEGATIVE
INFLUENZA B, POC: NEGATIVE

## 2014-10-12 MED ORDER — BENZONATATE 100 MG PO CAPS: 100.0000 mg | ORAL_CAPSULE | Freq: Three times a day (TID) | ORAL | Status: DC | PRN

## 2014-10-12 NOTE — Progress Notes (Signed)
   Subjective:    Patient ID: Felicia Shepard, female    DOB: 25-Nov-1980, 34 y.o.   MRN: [id number]  HPI   Patient is a 34 year old female who presents to the office today for fever and slightly productive cough. She started to have a cough on Wednesday night. Starting on Saturday she was achy and had chills with a fever up to 104 on Saturday. Has been taking Advil for fever without much relief. She denies nausea, vomiting, diarrhea or vomiting. She has no sinus issues. No recent travel out of the country but she did travel to [place] for a college job fair late week. Had flu shot this year.   Review of Systems  Constitutional: Positive for fever, chills, diaphoresis, activity change, appetite change and fatigue.  HENT: Negative for congestion, ear pain, facial swelling, hearing loss, rhinorrhea, sore throat and trouble swallowing.   Eyes: Negative for pain and itching.  Respiratory: Negative for shortness of breath and wheezing.   Gastrointestinal: Negative for nausea, vomiting, diarrhea and constipation.  Musculoskeletal: Negative for neck pain and neck stiffness.  Skin: Negative for rash.       Objective:   Physical Exam  Constitutional: She is oriented to person, place, and time. She appears well-developed and well-nourished. No distress.  HENT:  Head: Normocephalic and atraumatic.  Right Ear: External ear normal.  Left Ear: External ear normal.  Mouth/Throat: Oropharynx is clear and moist. No oropharyngeal exudate.  Eyes: Right eye exhibits no discharge. Left eye exhibits no discharge.  Cardiovascular: Normal rate, regular rhythm and normal heart sounds.  Exam reveals no gallop and no friction rub.   No murmur heard. Pulmonary/Chest: No respiratory distress. She has no wheezes. She has no rales. She exhibits no tenderness.  Musculoskeletal: Normal range of motion.  Lymphadenopathy:    She has no cervical adenopathy.  Neurological: She is alert and oriented to person, place, and  time.  Skin: Skin is warm. She is diaphoretic.  moist  Psychiatric: She has a normal mood and affect. Her behavior is normal. Judgment and thought content normal.       Assessment & Plan:   Fever, unspecified - Rapid flu came back negative - She is outside the Tamiflu window - Will still treat as flu at this time as her symptoms are flu-like  - Take Tylenol every 8 hours. She can alternate Ibuprofen if she feels like it works  - Drink plenty of fluids ( water, Gatorade, Pedialyte)  - Cold wash clothes to face - Get plenty of rest - Follow up if not feeling better by Friday  - If symptoms worsen or fever continues to rise then she needs to go to the ER  Cough - Tessalon pearls for relief of cough - Can also try OTC Delsym

## 2014-10-12 NOTE — Progress Notes (Signed)
Pre visit review using our clinic review tool, if applicable. No additional management support is needed unless otherwise documented below in the visit note. 

## 2014-10-12 NOTE — Patient Instructions (Addendum)
Your rapid flu test came back neagative, unfortunantly these tests are not always accurate. Your symptoms appear to say otherwise. We will treat you for the flu. Take 500-1000mg  of Tylenol every 6 hours or rotate tylenol and ibuprofen every six hours for the fever. Drink plenty of fluid. When you feel like you cannot drink anymore... Drink some more. Get plenty of rest.   Take the Tessalon Pearls for the cough. Start off with one capsule and if that does not work than you can take two. Take every 8 hours as needed.   Cool wet wash clothes on your forehead will also help break your fever.   If you are not feeling any better by Friday than please follow up. If your symptoms get worse or your fever continues to go up, please go to the ER. If was a pleasure to meet you and I am sorry you feel so bad. You should start to feel better in a few days.   Influenza Influenza ("the flu") is a viral infection of the respiratory tract. It occurs more often in winter months because people spend more time in close contact with one another. Influenza can make you feel very sick. Influenza easily spreads from person to person (contagious). CAUSES  Influenza is caused by a virus that infects the respiratory tract. You can catch the virus by breathing in droplets from an infected person's cough or sneeze. You can also catch the virus by touching something that was recently contaminated with the virus and then touching your mouth, nose, or eyes. RISKS AND COMPLICATIONS You may be at risk for a more severe case of influenza if you smoke cigarettes, have diabetes, have chronic heart disease (such as heart failure) or lung disease (such as asthma), or if you have a weakened immune system. Elderly people and pregnant women are also at risk for more serious infections. The most common problem of influenza is a lung infection (pneumonia). Sometimes, this problem can require emergency medical care and may be life  threatening. SIGNS AND SYMPTOMS  Symptoms typically last 4 to 10 days and may include:  Fever.  Chills.  Headache, body aches, and muscle aches.  Sore throat.  Chest discomfort and cough.  Poor appetite.  Weakness or feeling tired.  Dizziness.  Nausea or vomiting. DIAGNOSIS  Diagnosis of influenza is often made based on your history and a physical exam. A nose or throat swab test can be done to confirm the diagnosis. TREATMENT  In mild cases, influenza goes away on its own. Treatment is directed at relieving symptoms. For more severe cases, your health care provider may prescribe antiviral medicines to shorten the sickness. Antibiotic medicines are not effective because the infection is caused by a virus, not by bacteria. HOME CARE INSTRUCTIONS  Take medicines only as directed by your health care provider.  Use a cool mist humidifier to make breathing easier.  Get plenty of rest until your temperature returns to normal. This usually takes 3 to 4 days.  Drink enough fluid to keep your urine clear or pale yellow.  Cover yourmouth and nosewhen coughing or sneezing,and wash your handswellto prevent thevirusfrom spreading.  Stay homefromwork orschool untilthe fever is gonefor at least 581full day. PREVENTION  An annual influenza vaccination (flu shot) is the best way to avoid getting influenza. An annual flu shot is now routinely recommended for all adults in the [place] SEEK MEDICAL CARE IF:  You experiencechest pain, yourcough worsens,or you producemore mucus.  Youhave nausea,vomiting, ordiarrhea.  Your fever returns or gets worse. SEEK IMMEDIATE MEDICAL CARE IF:  You havetrouble breathing, you become short of breath,or your skin ornails becomebluish.  You have severe painor stiffnessin the neck.  You develop a sudden headache, or pain in the face or ear.  You have nausea or vomiting that you cannot control. MAKE SURE YOU:   Understand these  instructions.  Will watch your condition.  Will get help right away if you are not doing well or get worse. Document Released: 06/21/2000 Document Revised: 11/08/2013 Document Reviewed: 09/23/2011 [facility] Patient Information 2015 [place], [place]. This information is not intended to replace advice given to you by your health care provider. Make sure you discuss any questions you have with your health care provider. Fever, Adult A fever is a higher than normal body temperature. In an adult, an oral temperature around 98.6 F (37 C) is considered normal. A temperature of 100.4 F (38 C) or higher is generally considered a fever. Mild or moderate fevers generally have no long-term effects and often do not require treatment. Extreme fever (greater than or equal to 106 F or 41.1 C) can cause seizures. The sweating that may occur with repeated or prolonged fever may cause dehydration. Elderly people can develop confusion during a fever. A measured temperature can vary with:  Age.  Time of day.  Method of measurement (mouth, underarm, rectal, or ear). The fever is confirmed by taking a temperature with a thermometer. Temperatures can be taken different ways. Some methods are accurate and some are not.  An oral temperature is used most commonly. Electronic thermometers are fast and accurate.  An ear temperature will only be accurate if the thermometer is positioned as recommended by the manufacturer.  A rectal temperature is accurate and done for those adults who have a condition where an oral temperature cannot be taken.  An underarm (axillary) temperature is not accurate and not recommended. Fever is a symptom, not a disease.  CAUSES   Infections commonly cause fever.  Some noninfectious causes for fever include:  Some arthritis conditions.  Some thyroid or adrenal gland conditions.  Some immune system conditions.  Some types of cancer.  A medicine reaction.  High doses of  certain street drugs such as methamphetamine.  Dehydration.  Exposure to high outside or room temperatures.  Occasionally, the source of a fever cannot be determined. This is sometimes called a "fever of unknown origin" (FUO).  Some situations may lead to a temporary rise in body temperature that may go away on its own. Examples are:  Childbirth.  Surgery.  Intense exercise. HOME CARE INSTRUCTIONS   Take appropriate medicines for fever. Follow dosing instructions carefully. If you use acetaminophen to reduce the fever, be careful to avoid taking other medicines that also contain acetaminophen. Do not take aspirin for a fever if you are younger than age 25. There is an association with Reye's syndrome. Reye's syndrome is a rare but potentially deadly disease.  If an infection is present and antibiotics have been prescribed, take them as directed. Finish them even if you start to feel better.  Rest as needed.  Maintain an adequate fluid intake. To prevent dehydration during an illness with prolonged or recurrent fever, you may need to drink extra fluid.Drink enough fluids to keep your urine clear or pale yellow.  Sponging or bathing with room temperature water may help reduce body temperature. Do not use ice water or alcohol sponge baths.  Dress comfortably, but do not over-bundle. SEEK  MEDICAL CARE IF:   You are unable to keep fluids down.  You develop vomiting or diarrhea.  You are not feeling at least partly better after 3 days.  You develop new symptoms or problems. SEEK IMMEDIATE MEDICAL CARE IF:   You have shortness of breath or trouble breathing.  You develop excessive weakness.  You are dizzy or you faint.  You are extremely thirsty or you are making little or no urine.  You develop new pain that was not there before (such as in the head, neck, chest, back, or abdomen).  You have persistent vomiting and diarrhea for more than 1 to 2 days.  You develop a stiff  neck or your eyes become sensitive to light.  You develop a skin rash.  You have a fever or persistent symptoms for more than 2 to 3 days.  You have a fever and your symptoms suddenly get worse. MAKE SURE YOU:   Understand these instructions.  Will watch your condition.  Will get help right away if you are not doing well or get worse. Document Released: 12/18/2000 Document Revised: 11/08/2013 Document Reviewed: 04/25/2011 [facility] Patient Information 2015 [place], [place]. This information is not intended to replace advice given to you by your health care provider. Make sure you discuss any questions you have with your health care provider.

## 2014-10-13 ENCOUNTER — Other Ambulatory Visit: Payer: Self-pay | Admitting: Adult Health

## 2014-10-13 ENCOUNTER — Telehealth: Payer: Self-pay | Admitting: Internal Medicine

## 2014-10-13 ENCOUNTER — Encounter: Payer: Self-pay | Admitting: Adult Health

## 2014-10-13 ENCOUNTER — Ambulatory Visit (INDEPENDENT_AMBULATORY_CARE_PROVIDER_SITE_OTHER)
Admission: RE | Admit: 2014-10-13 | Discharge: 2014-10-13 | Disposition: A | Discharge status: Home / Self Care | Payer: BLUE CROSS/BLUE SHIELD | Source: Ambulatory Visit | Attending: Adult Health | Admitting: Adult Health

## 2014-10-13 ENCOUNTER — Other Ambulatory Visit (INDEPENDENT_AMBULATORY_CARE_PROVIDER_SITE_OTHER): Payer: BLUE CROSS/BLUE SHIELD

## 2014-10-13 ENCOUNTER — Telehealth: Payer: Self-pay | Admitting: Adult Health

## 2014-10-13 DIAGNOSIS — R05 Cough: Secondary | ICD-10-CM

## 2014-10-13 DIAGNOSIS — R509 Fever, unspecified: Secondary | ICD-10-CM

## 2014-10-13 DIAGNOSIS — R059 Cough, unspecified: Secondary | ICD-10-CM

## 2014-10-13 DIAGNOSIS — J189 Pneumonia, unspecified organism: Secondary | ICD-10-CM

## 2014-10-13 LAB — CBC WITH DIFFERENTIAL/PLATELET
BASOS ABS: 0 10*3/uL (ref 0.0–0.1)
BASOS PCT: 0.3 % (ref 0.0–3.0)
Eosinophils Absolute: 0 10*3/uL (ref 0.0–0.7)
Eosinophils Relative: 0.4 % (ref 0.0–5.0)
HEMATOCRIT: 38.2 % (ref 36.0–46.0)
HEMOGLOBIN: 13.2 g/dL (ref 12.0–15.0)
LYMPHS PCT: 22.2 % (ref 12.0–46.0)
Lymphs Abs: 1.6 10*3/uL (ref 0.7–4.0)
MCHC: 34.6 g/dL (ref 30.0–36.0)
MCV: 82.8 fl (ref 78.0–100.0)
MONOS PCT: 5.7 % (ref 3.0–12.0)
Monocytes Absolute: 0.4 10*3/uL (ref 0.1–1.0)
NEUTROS ABS: 5 10*3/uL (ref 1.4–7.7)
NEUTROS PCT: 71.4 % (ref 43.0–77.0)
Platelets: 242 10*3/uL (ref 150.0–400.0)
RBC: 4.61 Mil/uL (ref 3.87–5.11)
RDW: 12 % (ref 11.5–15.5)
WBC: 7 10*3/uL (ref 4.0–10.5)

## 2014-10-13 MED ORDER — LEVOFLOXACIN 500 MG PO TABS: 500.0000 mg | ORAL_TABLET | Freq: Every day | ORAL | Status: DC

## 2014-10-13 NOTE — Telephone Encounter (Signed)
Felicia Shepard returned your call .

## 2014-10-13 NOTE — Telephone Encounter (Signed)
Please call patient and see how she is feeling. If her fever hasn't broke and she is still feeling terrible, she should come in for a chest x ray and blood work.

## 2014-10-13 NOTE — Telephone Encounter (Signed)
Lowry RamFYI.  Called by on call nurse (10/13/14 - 1815).  Radiology called cxr report - new patchy bibasilar airspace opacity (worse on right) - suspicious for pneumonia.  Probable reactive hilar adenopathy.  Called pt to inform of results.  She had already been contacted and abx had been called in.

## 2014-10-13 NOTE — Telephone Encounter (Signed)
Spoke with pt earlier and advised of getting chest x ray and labs at [facility].  Once Felicia KeenCory has reviewed results I will call the patient.

## 2014-10-13 NOTE — Telephone Encounter (Signed)
Spoke with pt and pt states she has not had huge improvement.  Pt states her temperatiure is steady at about 100 to 100.5. Pt would like to have chest xray and blood work at [facility].  Ok per Gastonory to enter chest xray and CBC.

## 2014-10-13 NOTE — Telephone Encounter (Signed)
Left a message for pt to return call 

## 2014-10-17 ENCOUNTER — Telehealth: Payer: Self-pay

## 2014-10-17 MED ORDER — ALBUTEROL SULFATE HFA 108 (90 BASE) MCG/ACT IN AERS: 2.0000 | INHALATION_SPRAY | Freq: Four times a day (QID) | RESPIRATORY_TRACT | Status: DC | PRN

## 2014-10-17 NOTE — Telephone Encounter (Signed)
Per Cory's request called pt to see how she was feeling. Left a message for return call.

## 2014-10-17 NOTE — Telephone Encounter (Signed)
Called and spoke with pt and pt state she is winded and out of breath but feeling better. Pt states the cough is few and far in between and the fever has broken.  Per Kandee Keenory advised pt that we could call in an Albuterol inhaler to the pharmacy.  Advised pt if she was experiencing difficulty breathing that she should seek medical attention immediately. Advised pt to continue antibiotic and call us on Wednesday and give us an update on how she is feeling.  Pt verbalized understanding.

## 2014-11-02 ENCOUNTER — Other Ambulatory Visit: Payer: Self-pay | Admitting: Obstetrics and Gynecology

## 2014-11-03 LAB — CYTOLOGY - PAP

## 2014-12-08 ENCOUNTER — Telehealth: Payer: Self-pay | Admitting: Internal Medicine

## 2014-12-08 NOTE — Telephone Encounter (Signed)
Noted  

## 2014-12-08 NOTE — Telephone Encounter (Signed)
[facility] - Client TELEPHONE ADVICE RECORD [facility] Patient Name: Felicia Shepard DOB: 1980/09/19 Initial Comment Caller states may has infection in eye. Nurse Assessment Guidelines Guideline Title Affirmed Question Affirmed Notes Eye - Pus or Discharge [1] Eye with yellow/green discharge or eyelashes stick together AND [2] PCP standing order to call in antibiotic eye drops (all triage questions negative) Final Disposition User [facility] ShindlerKoenig, RN, Alexia Freestonenna Marie

## 2014-12-09 ENCOUNTER — Ambulatory Visit: Payer: BLUE CROSS/BLUE SHIELD | Admitting: Internal Medicine

## 2015-04-12 ENCOUNTER — Ambulatory Visit (INDEPENDENT_AMBULATORY_CARE_PROVIDER_SITE_OTHER): Payer: BLUE CROSS/BLUE SHIELD | Admitting: Internal Medicine

## 2015-04-12 ENCOUNTER — Encounter: Payer: Self-pay | Admitting: Internal Medicine

## 2015-04-12 VITALS — BP 130/82 | Temp 98.4°F | Wt 185.7 lb

## 2015-04-12 DIAGNOSIS — Z23 Encounter for immunization: Secondary | ICD-10-CM

## 2015-04-12 DIAGNOSIS — J019 Acute sinusitis, unspecified: Secondary | ICD-10-CM

## 2015-04-12 MED ORDER — AMOXICILLIN-POT CLAVULANATE 875-125 MG PO TABS: 1.0000 | ORAL_TABLET | Freq: Two times a day (BID) | ORAL | Status: DC

## 2015-04-12 NOTE — Patient Instructions (Signed)
Sinus  infections  Can get better on their own . Saline nose spray.  Decongestants   Nasal steroid spray if daily   Can add antibiotic if  Not starting to improve in the next   Few days.    Sinusitis, Adult Sinusitis is redness, soreness, and inflammation of the paranasal sinuses. Paranasal sinuses are air pockets within the bones of your face. They are located beneath your eyes, in the middle of your forehead, and above your eyes. In healthy paranasal sinuses, mucus is able to drain out, and air is able to circulate through them by way of your nose. However, when your paranasal sinuses are inflamed, mucus and air can become trapped. This can allow bacteria and other germs to grow and cause infection. Sinusitis can develop quickly and last only a short time (acute) or continue over a long period (chronic). Sinusitis that lasts for more than 12 weeks is considered chronic. CAUSES Causes of sinusitis include:  Allergies.  Structural abnormalities, such as displacement of the cartilage that separates your nostrils (deviated septum), which can decrease the air flow through your nose and sinuses and affect sinus drainage.  Functional abnormalities, such as when the small hairs (cilia) that line your sinuses and help remove mucus do not work properly or are not present. SIGNS AND SYMPTOMS Symptoms of acute and chronic sinusitis are the same. The primary symptoms are pain and pressure around the affected sinuses. Other symptoms include:  Upper toothache.  Earache.  Headache.  Bad breath.  Decreased sense of smell and taste.  A cough, which worsens when you are lying flat.  Fatigue.  Fever.  Thick drainage from your nose, which often is green and may contain pus (purulent).  Swelling and warmth over the affected sinuses. DIAGNOSIS Your health care provider will perform a physical exam. During your exam, your health care provider may perform any of the following to help determine if you  have acute sinusitis or chronic sinusitis:  Look in your nose for signs of abnormal growths in your nostrils (nasal polyps).  Tap over the affected sinus to check for signs of infection.  View the inside of your sinuses using an imaging device that has a light attached (endoscope). If your health care provider suspects that you have chronic sinusitis, one or more of the following tests may be recommended:  Allergy tests.  Nasal culture. A sample of mucus is taken from your nose, sent to a lab, and screened for bacteria.  Nasal cytology. A sample of mucus is taken from your nose and examined by your health care provider to determine if your sinusitis is related to an allergy. TREATMENT Most cases of acute sinusitis are related to a viral infection and will resolve on their own within 10 days. Sometimes, medicines are prescribed to help relieve symptoms of both acute and chronic sinusitis. These may include pain medicines, decongestants, nasal steroid sprays, or saline sprays. However, for sinusitis related to a bacterial infection, your health care provider will prescribe antibiotic medicines. These are medicines that will help kill the bacteria causing the infection. Rarely, sinusitis is caused by a fungal infection. In these cases, your health care provider will prescribe antifungal medicine. For some cases of chronic sinusitis, surgery is needed. Generally, these are cases in which sinusitis recurs more than 3 times per year, despite other treatments. HOME CARE INSTRUCTIONS  Drink plenty of water. Water helps thin the mucus so your sinuses can drain more easily.  Use a humidifier.  Inhale steam 3-4 times a day (for example, sit in the bathroom with the shower running).  Apply a warm, moist washcloth to your face 3-4 times a day, or as directed by your health care provider.  Use saline nasal sprays to help moisten and clean your sinuses.  Take medicines only as directed by your health  care provider.  If you were prescribed either an antibiotic or antifungal medicine, finish it all even if you start to feel better. SEEK IMMEDIATE MEDICAL CARE IF:  You have increasing pain or severe headaches.  You have nausea, vomiting, or drowsiness.  You have swelling around your face.  You have vision problems.  You have a stiff neck.  You have difficulty breathing.   This information is not intended to replace advice given to you by your health care provider. Make sure you discuss any questions you have with your health care provider.   Document Released: 06/24/2005 Document Revised: 07/15/2014 Document Reviewed: 07/09/2011 [employer] Interactive Patient Education [employer]

## 2015-04-12 NOTE — Progress Notes (Signed)
Pre visit review using our clinic review tool, if applicable. No additional management support is needed unless otherwise documented below in the visit note.  Chief Complaint  Patient presents with  . Cough    Ongoing for 8-9 days  . Nasal Congestion  . Headache  . Sinus Pressure    HPI: Patient Felicia Shepard  comes in today for SDA for  new problem evaluation. Onset like a head cold but now continuing getting facial pressure and somewhat of a headache  Facial pressure    To go out of town. [place] for wedding. Not that bad  Aching . Upper teeth noted has tried alvil cold and sinus . Some cough no shortness of breath no fever or chills. Question can she get her flu vaccine today.      ROS: See pertinent positives and negatives per HPI. No chest pain shortness of breath or wheezing today. No specific allergies noted  Past Medical History  Diagnosis Date  . Abnormal Pap smear   . Hx of varicella   . Amoxicillin rash     REACTION: rash- One time reaction when dx with Mono    Family History  Problem Relation Age of Onset  . Cancer Mother     leaukemia  . Hypertension Father   . Autism Brother   . Hypertension Maternal Grandmother   . Heart disease Maternal Grandfather   . Diabetes Maternal Grandfather   . Heart disease Paternal Grandmother   . Hypertension Paternal Grandmother   . Diabetes Paternal Grandmother     Social History   Social History  . Marital Status: Married    Spouse Name: N/A  . Number of Children: N/A  . Years of Education: N/A   Social History Main Topics  . Smoking status: Never Smoker   . Smokeless tobacco: None  . Alcohol Use: No  . Drug Use: No  . Sexual Activity: Yes   Other Topics Concern  . None   Social History Narrative   Usually receives 8 hours of sleep per night   3 people living in the home.   No animals in the home.   College degree married employed Automotive engineer   Gravida 1 para 1   Social alcohol no tobacco local  learning and home.       EXAM:  BP 130/82 mmHg  Temp(Src) 98.4 F (36.9 C) (Oral)  Wt 185 lb 11.2 oz (84.233 kg)  Body mass index is 30.9 kg/(m^2). WDWN in NAD  quiet respirations; moderately congested  . Non toxic . HEENT: Normocephalic ;atraumatic , Eyes;  PERRL, EOMs  Full, lids and conjunctiva clear,,Ears: no deformities, canals nl, TM landmarks normal, retracted on right Nose: no deformity mucoid discharge  congested;face mild to moderate tender Mouth : OP clear without lesion or edema . Cobblestoning noted no exudate Neck: Supple without adenopathy or masses or bruits Chest:  Clear to A without wheezes rales or rhonchi CV:  S1-S2 no gallops or murmurs peripheral perfusion is normal Skin :nl perfusion and no acute rashes   ASSESSMENT AND PLAN:  Discussed the following assessment and plan:  Acute rhinosinusitis - exepcatnta mangement  if not resolving can add antibiotic see text   Ok for flu vaccine -Patient advised to return or notify health care team  if symptoms worsen ,persist or new concerns arise.  Patient Instructions  Sinus  infections  Can get better on their own . Saline nose spray.  Decongestants   Nasal steroid spray if  daily   Can add antibiotic if  Not starting to improve in the next   Few days.    Sinusitis, Adult Sinusitis is redness, soreness, and inflammation of the paranasal sinuses. Paranasal sinuses are air pockets within the bones of your face. They are located beneath your eyes, in the middle of your forehead, and above your eyes. In healthy paranasal sinuses, mucus is able to drain out, and air is able to circulate through them by way of your nose. However, when your paranasal sinuses are inflamed, mucus and air can become trapped. This can allow bacteria and other germs to grow and cause infection. Sinusitis can develop quickly and last only a short time (acute) or continue over a long period (chronic). Sinusitis that lasts for more than 12 weeks is  considered chronic. CAUSES Causes of sinusitis include:  Allergies.  Structural abnormalities, such as displacement of the cartilage that separates your nostrils (deviated septum), which can decrease the air flow through your nose and sinuses and affect sinus drainage.  Functional abnormalities, such as when the small hairs (cilia) that line your sinuses and help remove mucus do not work properly or are not present. SIGNS AND SYMPTOMS Symptoms of acute and chronic sinusitis are the same. The primary symptoms are pain and pressure around the affected sinuses. Other symptoms include:  Upper toothache.  Earache.  Headache.  Bad breath.  Decreased sense of smell and taste.  A cough, which worsens when you are lying flat.  Fatigue.  Fever.  Thick drainage from your nose, which often is green and may contain pus (purulent).  Swelling and warmth over the affected sinuses. DIAGNOSIS Your health care provider will perform a physical exam. During your exam, your health care provider may perform any of the following to help determine if you have acute sinusitis or chronic sinusitis:  Look in your nose for signs of abnormal growths in your nostrils (nasal polyps).  Tap over the affected sinus to check for signs of infection.  View the inside of your sinuses using an imaging device that has a light attached (endoscope). If your health care provider suspects that you have chronic sinusitis, one or more of the following tests may be recommended:  Allergy tests.  Nasal culture. A sample of mucus is taken from your nose, sent to a lab, and screened for bacteria.  Nasal cytology. A sample of mucus is taken from your nose and examined by your health care provider to determine if your sinusitis is related to an allergy. TREATMENT Most cases of acute sinusitis are related to a viral infection and will resolve on their own within 10 days. Sometimes, medicines are prescribed to help relieve  symptoms of both acute and chronic sinusitis. These may include pain medicines, decongestants, nasal steroid sprays, or saline sprays. However, for sinusitis related to a bacterial infection, your health care provider will prescribe antibiotic medicines. These are medicines that will help kill the bacteria causing the infection. Rarely, sinusitis is caused by a fungal infection. In these cases, your health care provider will prescribe antifungal medicine. For some cases of chronic sinusitis, surgery is needed. Generally, these are cases in which sinusitis recurs more than 3 times per year, despite other treatments. HOME CARE INSTRUCTIONS  Drink plenty of water. Water helps thin the mucus so your sinuses can drain more easily.  Use a humidifier.  Inhale steam 3-4 times a day (for example, sit in the bathroom with the shower running).  Apply a warm, moist  washcloth to your face 3-4 times a day, or as directed by your health care provider.  Use saline nasal sprays to help moisten and clean your sinuses.  Take medicines only as directed by your health care provider.  If you were prescribed either an antibiotic or antifungal medicine, finish it all even if you start to feel better. SEEK IMMEDIATE MEDICAL CARE IF:  You have increasing pain or severe headaches.  You have nausea, vomiting, or drowsiness.  You have swelling around your face.  You have vision problems.  You have a stiff neck.  You have difficulty breathing.   This information is not intended to replace advice given to you by your health care provider. Make sure you discuss any questions you have with your health care provider.   Document Released: 06/24/2005 Document Revised: 07/15/2014 Document Reviewed: 07/09/2011 Elsevier Interactive Patient Education 2016 [employer].      [place]. Panosh M.D.

## 2015-07-04 IMAGING — CR DG FINGER MIDDLE 2+V*L*
1 series · 1 of 1 positions shown · non-contrast
Comparison: None.

CLINICAL DATA: Fall [DATE] months ago.  Pain.  Swelling .

EXAM:
LEFT MIDDLE FINGER 2+V

[view not recorded]
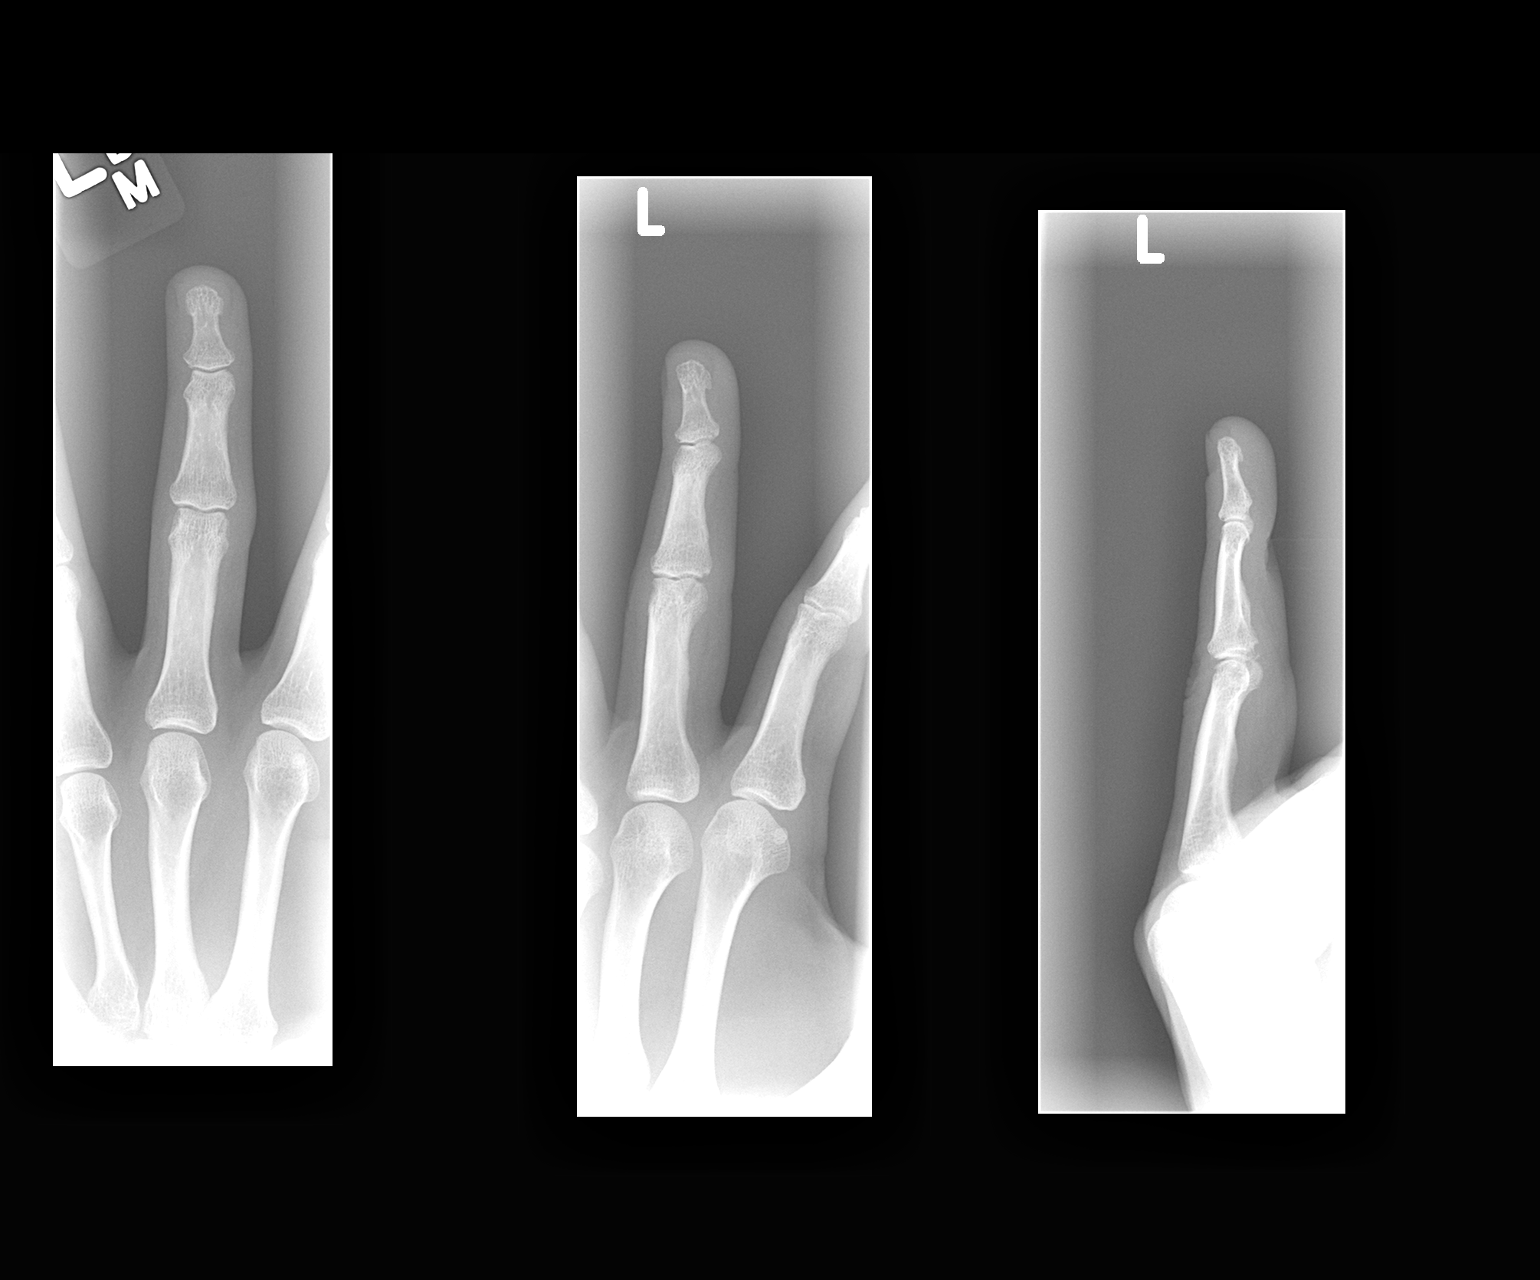

[1 of 1 positions shown; findings below may reference images not displayed]

FINDINGS: There is a fracture of the distal aspect of the proximal phalanx of
the left fourth digit. Fracture is nondisplaced.
IMPRESSION: Nondisplaced fracture of the distal aspect of the proximal phalanx
of the left fourth digit.

## 2015-10-03 ENCOUNTER — Telehealth: Payer: Self-pay | Admitting: Internal Medicine

## 2015-10-03 ENCOUNTER — Ambulatory Visit (INDEPENDENT_AMBULATORY_CARE_PROVIDER_SITE_OTHER): Payer: BLUE CROSS/BLUE SHIELD | Admitting: Family Medicine

## 2015-10-03 ENCOUNTER — Encounter: Payer: Self-pay | Admitting: Family Medicine

## 2015-10-03 VITALS — BP 130/84 | HR 100 | Temp 99.0°F | Ht 65.0 in | Wt 181.0 lb

## 2015-10-03 DIAGNOSIS — N390 Urinary tract infection, site not specified: Secondary | ICD-10-CM | POA: Diagnosis not present

## 2015-10-03 LAB — POC URINALSYSI DIPSTICK (AUTOMATED)
BILIRUBIN UA: NEGATIVE
GLUCOSE UA: NEGATIVE
KETONES UA: NEGATIVE
Nitrite, UA: NEGATIVE
Urobilinogen, UA: 0.2
pH, UA: 6

## 2015-10-03 MED ORDER — CIPROFLOXACIN HCL 500 MG PO TABS: 500.0000 mg | ORAL_TABLET | Freq: Two times a day (BID) | ORAL | Status: DC

## 2015-10-03 NOTE — Progress Notes (Signed)
   Subjective:    Patient ID: Felicia Shepard, female    DOB: April 08, 1981, 35 y.o.   MRN: [id number]  HPI Here for 2 days of urinary urgency and burning. No fever or nausea. Drinking water. Her last UTI was 2 years ago.    Review of Systems  Constitutional: Negative.   Genitourinary: Positive for dysuria, urgency and frequency. Negative for hematuria, flank pain and pelvic pain.       Objective:   Physical Exam  Constitutional: She appears well-developed and well-nourished.  Abdominal: Soft. Bowel sounds are normal. She exhibits no distension and no mass. There is no tenderness. There is no rebound and no guarding.          Assessment & Plan:  UTI, treat with Cipro. Culture the sample.

## 2015-10-03 NOTE — Progress Notes (Signed)
Pre visit review using our clinic review tool, if applicable. No additional management support is needed unless otherwise documented below in the visit note. 

## 2015-10-03 NOTE — Telephone Encounter (Signed)
Appt schedule with Dr. Clent RidgesFry

## 2015-10-03 NOTE — Telephone Encounter (Signed)
[facility] - Client TELEPHONE ADVICE RECORD [facility]  Patient Name: Felicia Shepard  DOB: September 06, 1980    Initial Comment Caller States she woke up with a UTI, frequency and pain with urination   Nurse Assessment  Nurse: Felicia BenesJohnson, RN, Felicia Shepard Date/Time (Eastern Time): 10/03/2015 8:53:04 AM  Confirm and document reason for call. If symptomatic, describe symptoms. You must click the next button to save text entered. ---Felicia Shepard woke up frequency and urgency burning of urine this am.  Has the patient traveled out of the country within the last 30 days? ---No  Does the patient have any new or worsening symptoms? ---Yes  Will a triage be completed? ---Yes  Related visit to physician within the last 2 weeks? ---No  Does the PT have any chronic conditions? (i.e. diabetes, asthma, etc.) ---Unknown  Is the patient pregnant or possibly pregnant? (Ask all females between the ages of 7712-55) ---No  Is this a behavioral health or substance abuse call? ---No     Guidelines    Guideline Title Affirmed Question Affirmed Notes  Urination Pain - Female Blood in urine (red, pink, or tea-colored)    Final Disposition User   See Physician within 24 Hours Felicia BenesJohnson, RN, Felicia Shepard    Comments  NOTE: no appt with Dr. Berniece AndreasWanda Panosh for today given appt today with arrival time at 315pm with appt time at 330pm with Jeannett SeniorStephen A. Clent RidgesFry r/o UTI s/sx frequency, urgency buning onset today no fevers   Referrals  REFERRED TO PCP OFFICE   Disagree/Comply: Comply

## 2015-10-06 LAB — URINE CULTURE

## 2015-10-12 ENCOUNTER — Other Ambulatory Visit: Payer: Self-pay | Admitting: Obstetrics and Gynecology

## 2015-11-16 ENCOUNTER — Other Ambulatory Visit: Payer: Self-pay | Admitting: Obstetrics and Gynecology

## 2015-11-17 LAB — CYTOLOGY - PAP

## 2015-12-04 IMAGING — CR DG CHEST 2V
2 series · 2 of 2 positions shown · non-contrast
Comparison: 05/13/2014 radiographs

CLINICAL DATA: Cough and congestion for 11 days. Fever. Initial
encounter.

EXAM:
CHEST  2 VIEW

[view not recorded (1 of 2)]
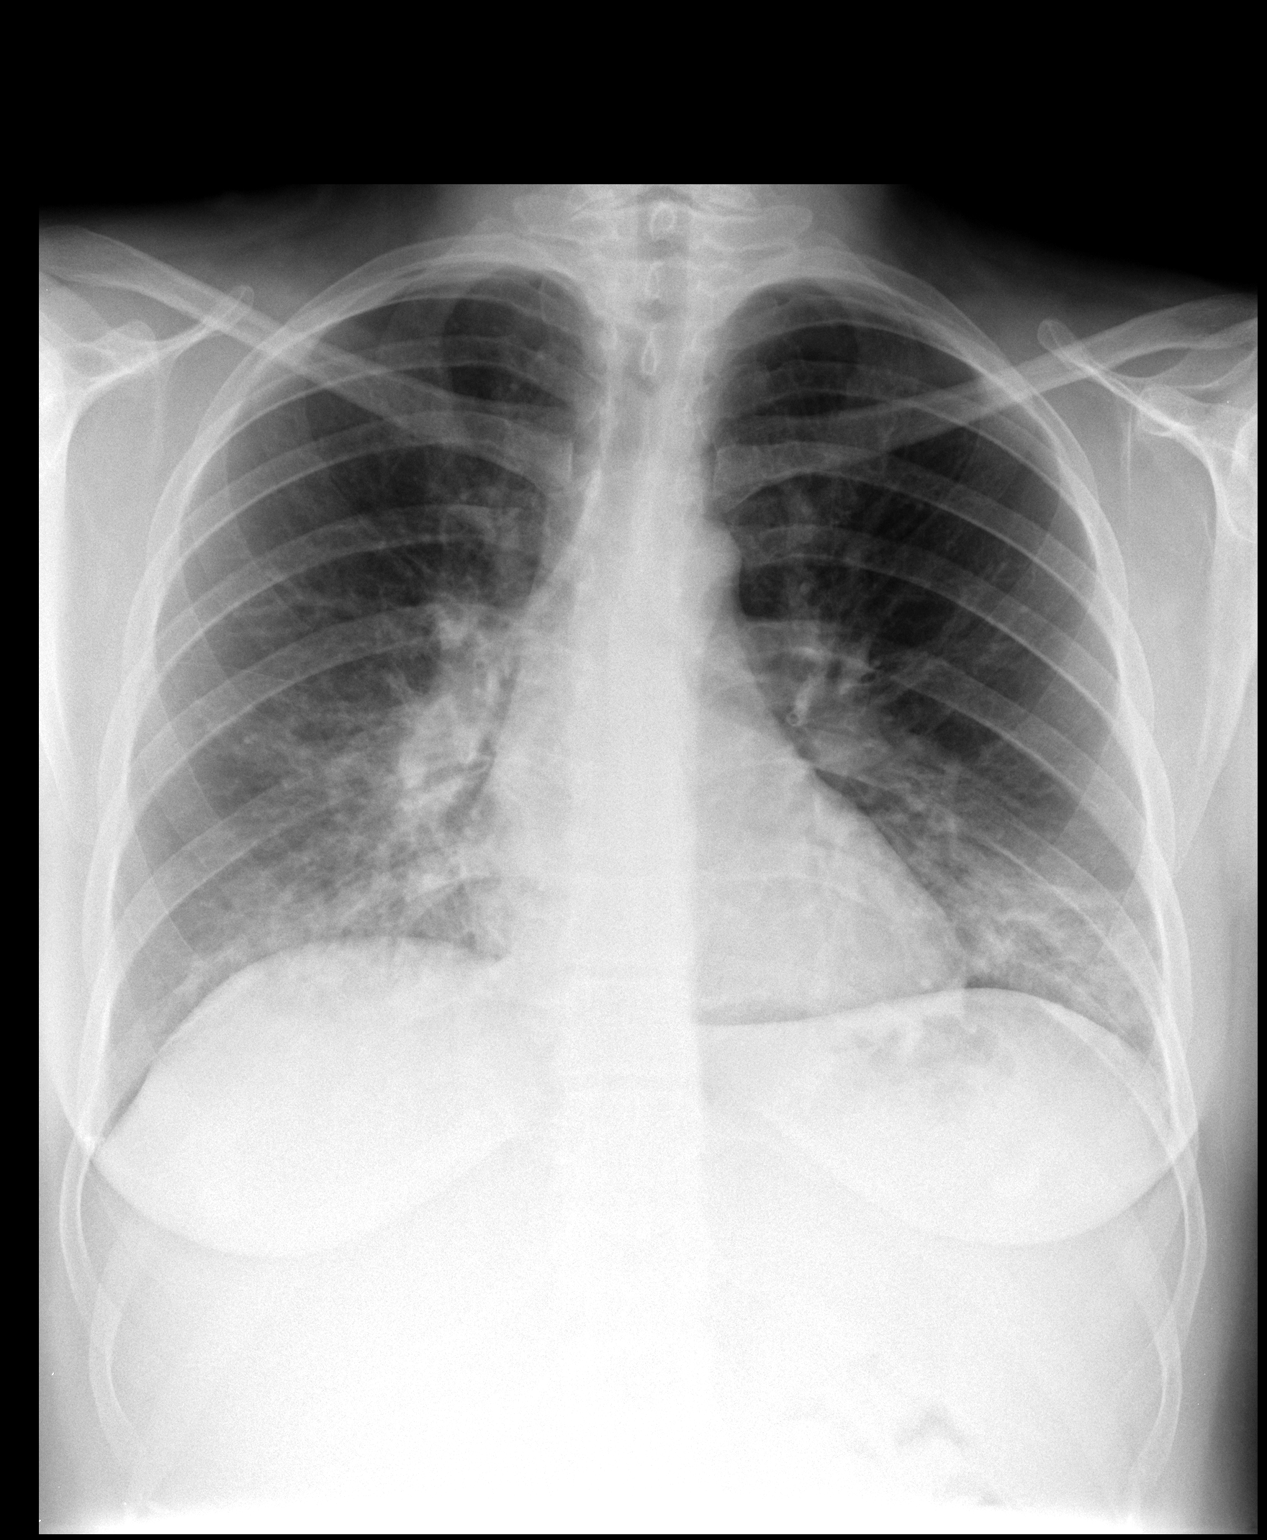

[view not recorded (2 of 2)]
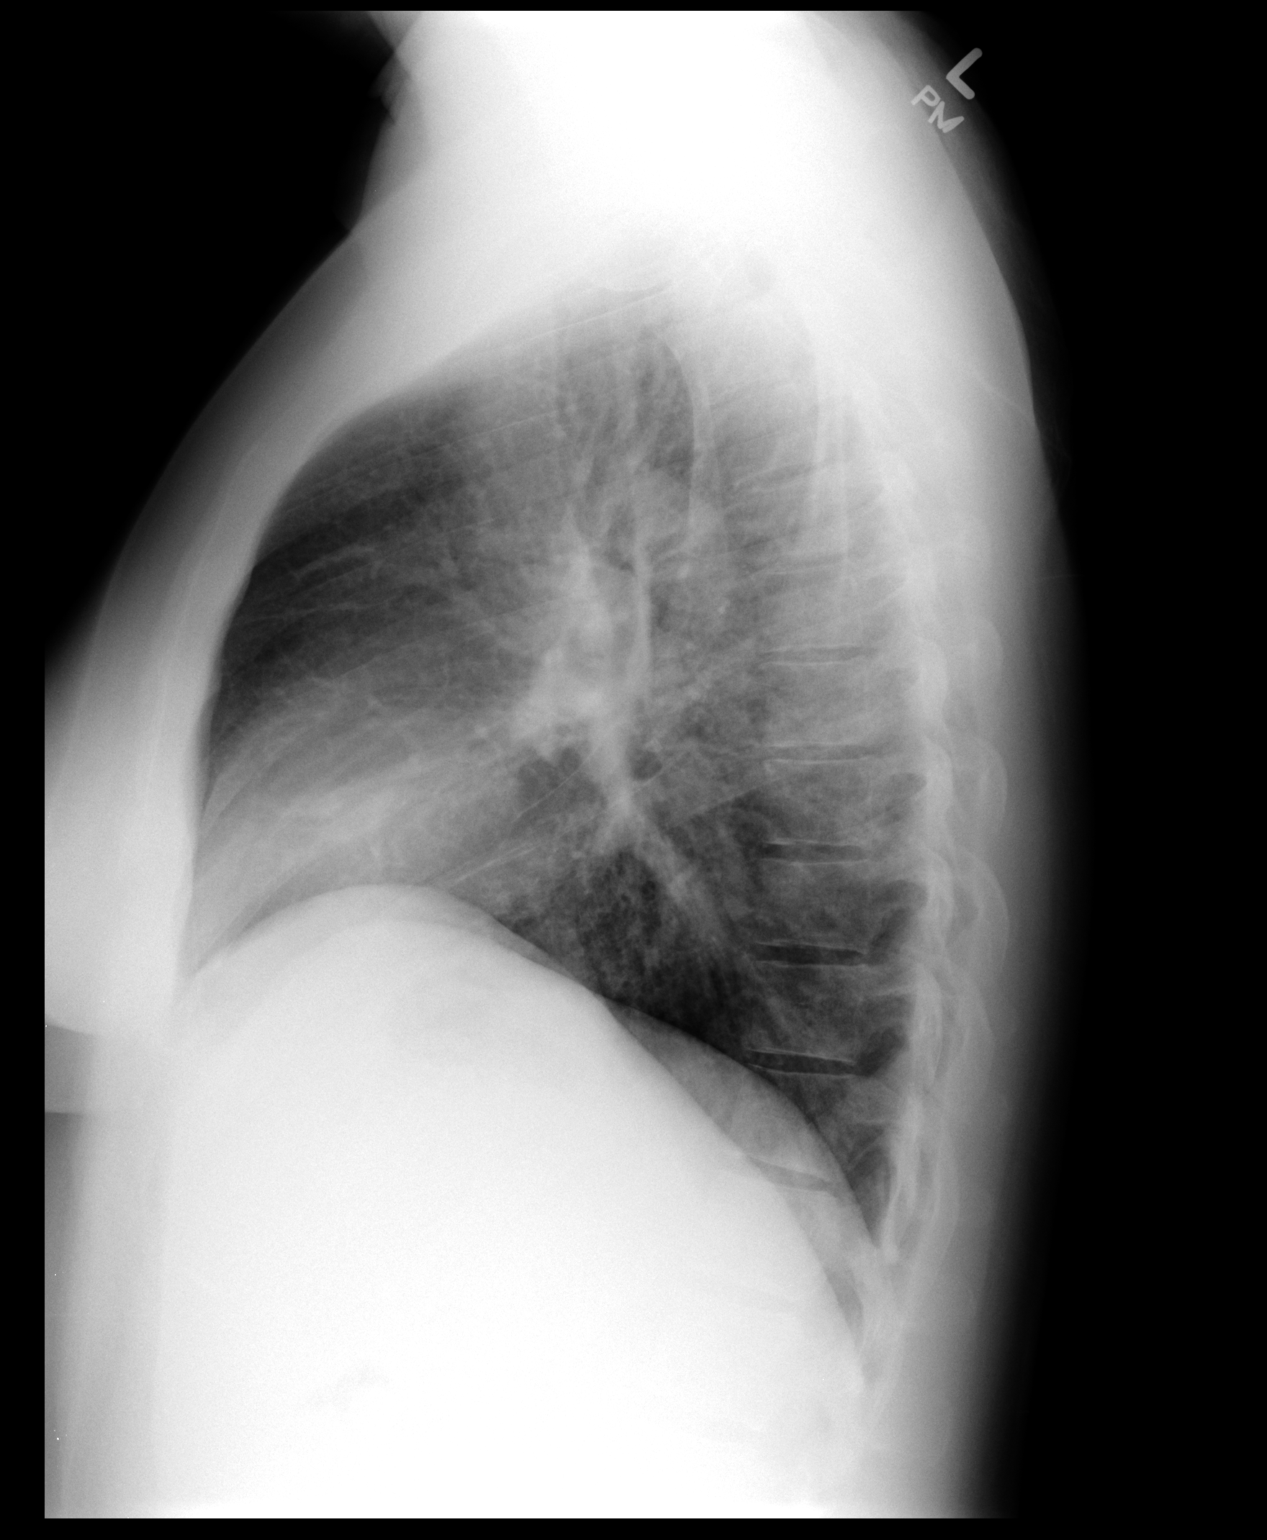

[2 of 2 positions shown; findings below may reference images not displayed]

FINDINGS: The heart size is stable. There is new mild prominence of the right
hilum with adjacent patchy right middle and lower lobe airspace
disease. There is also mild patchy left lower lobe airspace disease.
There is no consolidation or significant pleural effusion. The bones
appear unchanged.
IMPRESSION: New patchy bibasilar airspace opacities, worse on the right,
suspicious for pneumonia. Probable reactive right hilar adenopathy.
Radiographic follow up recommended.

These results will be called to the ordering clinician or
representative by the Radiologist Assistant, and communication
documented in the PACS or zVision Dashboard.

## 2016-02-12 ENCOUNTER — Other Ambulatory Visit: Payer: Self-pay | Admitting: Obstetrics & Gynecology

## 2016-06-10 ENCOUNTER — Ambulatory Visit (INDEPENDENT_AMBULATORY_CARE_PROVIDER_SITE_OTHER): Payer: Managed Care, Other (non HMO) | Admitting: Internal Medicine

## 2016-06-10 ENCOUNTER — Encounter: Payer: Self-pay | Admitting: Internal Medicine

## 2016-06-10 VITALS — BP 124/82 | Temp 98.6°F | Wt 180.8 lb

## 2016-06-10 DIAGNOSIS — J029 Acute pharyngitis, unspecified: Secondary | ICD-10-CM | POA: Diagnosis not present

## 2016-06-10 DIAGNOSIS — J039 Acute tonsillitis, unspecified: Secondary | ICD-10-CM | POA: Diagnosis not present

## 2016-06-10 LAB — POCT RAPID STREP A (OFFICE): Rapid Strep A Screen: NEGATIVE

## 2016-06-10 MED ORDER — CEPHALEXIN 500 MG PO CAPS: 500.0000 mg | ORAL_CAPSULE | Freq: Two times a day (BID) | ORAL | 0 refills | Status: AC

## 2016-06-10 NOTE — Progress Notes (Signed)
Chief Complaint  Patient presents with  . Sore Throat  . Fatigue    HPI: Felicia Shepard 35 y.o.   Onset  About nov 20 and did an e  Video  visit and    With picture of throat  and given amox 500 bid for 10 days  . St improed after 2-3 days rx  fatigue at end of med    But off med for 2-3 days and now st again with malaise and  Tender glands  Throat red with white spots   Run down   If fever low grade .     hurts  To swallow .  ROS: See pertinent positives and negatives per HPI. Remote hx of mono  No cough iof sig  Runny nose  nvd exposures  Per se  Traveled family .   No nvd rash  No hx of recurrent throat  Says could have got back from kissing relative    Past Medical History:  Diagnosis Date  . Abnormal Pap smear   . Amoxicillin rash    REACTION: rash- One time reaction when dx with Mono  . Hx of varicella     Family History  Problem Relation Age of Onset  . Cancer Mother     leaukemia  . Hypertension Father   . Autism Brother   . Hypertension Maternal Grandmother   . Heart disease Maternal Grandfather   . Diabetes Maternal Grandfather   . Heart disease Paternal Grandmother   . Hypertension Paternal Grandmother   . Diabetes Paternal Grandmother     Social History   Social History  . Marital status: Married    Spouse name: N/A  . Number of children: N/A  . Years of education: N/A   Social History Main Topics  . Smoking status: Never Smoker  . Smokeless tobacco: None  . Alcohol use No  . Drug use: No  . Sexual activity: Yes   Other Topics Concern  . None   Social History Narrative   Usually receives 8 hours of sleep per night   3 people living in the home.   No animals in the home.   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    Outpatient Medications Prior to Visit  Medication Sig Dispense Refill  . Prenatal Vit-Fe Fumarate-FA (PRENATAL MULTIVITAMIN) TABS Take 1 tablet by  mouth daily.    . Probiotic Product (PROBIOTIC DAILY PO) Take by mouth.    Marland Kitchen. albuterol (PROVENTIL HFA;VENTOLIN HFA) 108 (90 BASE) MCG/ACT inhaler Inhale 2 puffs into the lungs every 6 (six) hours as needed for wheezing or shortness of breath. (Patient not taking: Reported on 10/03/2015) 1 Inhaler 2  . amoxicillin-clavulanate (AUGMENTIN) 875-125 MG tablet Take 1 tablet by mouth every 12 (twelve) hours. (Patient not taking: Reported on 10/03/2015) 14 tablet 0  . ciprofloxacin (CIPRO) 500 MG tablet Take 1 tablet (500 mg total) by mouth 2 (two) times daily. 14 tablet 0   No facility-administered medications prior to visit.      EXAM:  BP 124/82 (BP Location: Right Arm, Patient Position: Sitting, Cuff Size: Normal)   Temp 98.6 F (37 C) (Oral)   Wt 180 lb 12.8 oz (82 kg)   BMI 30.09 kg/m   Body mass index is 30.09 kg/m.  GENERAL: vitals reviewed and listed above, alert, oriented, appears well hydrated and in no acute distress HEENT: atraumatic, conjunctiva  clear, no obvious abnormalities on inspection of external nose and ears tms nl  Nares clear OP :  tonsil hypertrophied and red 2 +   Pin point exudate posterior  No edema  NECK: no obvious masses on inspection tender ac  2+ nodes mobile  Neg pc  LUNGS: clear to auscultation bilaterally, no wheezes, rales or rhonchi, good air movement CV: HRRR, no clubbing cyanosis or  peripheral edema nl cap refill  Abdomen:  Sof,t normal bowel sounds without hepatosplenomegaly, no guarding rebound or masses no CVA tendernessMS: moves all extremities without noticeable focal  abnormality PSYCH: pleasant and cooperative, no obvious depression or anxiety  ASSESSMENT AND PLAN:  Discussed the following assessment and plan:  Acute tonsillitis, unspecified etiology - with adenopathy rx on videa visit empirically and clincal relapse by hx  past hx mono empiric keflex tcx pending  - Plan: Culture, Group A Strep  Sore throat - Plan: POC Rapid Strep A, Throat  culture, Culture, Group A Strep   Expectant management.  And follow up  -Patient advised to return or notify health care team  if symptoms worsen ,persist or new concerns arise.  Patient Instructions  This problem acts like a relapsing tonsillitis of unknown cause. It would not be mono because you have had this before although there are other viruses that can do this they usually don't wax and wane like this. Once they get better they're better. We'll begin him. Antibiotic Keflex pending culture. Let you know those results.   Plan fu  depending on how you are doing.    Neta MendsWanda K. Michele Judy M.D.

## 2016-06-10 NOTE — Progress Notes (Signed)
Pre visit review using our clinic review tool, if applicable. No additional management support is needed unless otherwise documented below in the visit note. 

## 2016-06-10 NOTE — Patient Instructions (Signed)
This problem acts like a relapsing tonsillitis of unknown cause. It would not be mono because you have had this before although there are other viruses that can do this they usually don't wax and wane like this. Once they get better they're better. We'll begin him. Antibiotic Keflex pending culture. Let you know those results.   Plan fu  depending on how you are doing.

## 2016-06-12 LAB — CULTURE, GROUP A STREP: Organism ID, Bacteria: NORMAL

## 2016-10-09 ENCOUNTER — Other Ambulatory Visit: Payer: Managed Care, Other (non HMO)

## 2016-10-15 NOTE — Progress Notes (Signed)
Chief Complaint  Patient presents with  . Annual Exam    HPI: Patient  Felicia Shepard  36 y.o. comes in today for Preventive Health Care visit  Last checkup was a few years ago. She sees a GYN has an IUD. Has some fatigue recently but hadn't been sleeping very well until recently has a 56-year-old at home job 35+ hours a week some relationship and workplace issues. Has seen a counselor in the remote past last time 6 months ago. No anxiety attacks per se hasn't been exercising much but is going back to the gym.   Health Maintenance  Topic Date Due  . INFLUENZA VACCINE  02/05/2017  . PAP SMEAR  11/16/2018  . TETANUS/TDAP  09/11/2022  . 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 GI /GU: No adominal pain, vomiting, change in bowel habits. No blood in the stool. No significant GU symptoms. SKIN/HEME: ,no acute skin rashes suspicious lesions or bleeding. No lymphadenopathy, nodules, masses.  NEURO/ PSYCH:  No neurologic signs such as weakness numbness. No depression anxiety. IMM/ Allergy: No unusual infections.  Allergy .   REST of 12 system review negative except as per HPI   Past Medical History:  Diagnosis Date  . Abnormal Pap smear   . Amoxicillin rash    REACTION: rash- One time reaction when dx with Mono  . Hx of varicella     Past Surgical History:  Procedure Laterality Date  . CESAREAN SECTION N/A 09/09/2012   Procedure: CESAREAN SECTION;  Surgeon: Loney Laurence, MD;  Location: [facility];  Service: Obstetrics;  Laterality: N/A;  Primary  Cesarean Section Delivery Baby Boy @ [phone], Apgars 1/7/9  . CHOLECYSTECTOMY    . CHOLECYSTECTOMY  2008  . COLPOSCOPY    . NO PAST SURGERIES      Family History  Problem Relation Age of Onset  . Cancer Mother     leaukemia  . Hypertension Father   . Autism Brother   . Hypertension Maternal Grandmother   . Heart disease Maternal Grandfather   . Diabetes Maternal Grandfather   . Heart disease Paternal Grandmother   . Hypertension Paternal Grandmother   . Diabetes Paternal Grandmother     Social History   Social History  . Marital status: Married    Spouse name: N/A  . Number of children: N/A  . Years of education: N/A   Social History Main Topics  . Smoking status: Never Smoker  . Smokeless tobacco: Never Used  . Alcohol use No  . Drug use: No  . Sexual activity: Yes    Birth control/ protection: IUD   Other Topics Concern  . None   Social History Narrative   Usually receives 8 hours of sleep per night   2 people living in the home.   No animals in the home.   College degree ex married employed Automotive engineer   Gravida 1 para 1   Social alcohol no tobacco local learning and home.    Outpatient Medications Prior to Visit  Medication Sig  Dispense Refill  . Probiotic Product (PROBIOTIC DAILY PO) Take by mouth.    . Prenatal Vit-Fe Fumarate-FA (PRENATAL MULTIVITAMIN) TABS Take 1 tablet by mouth daily.     No facility-administered medications prior to visit.      EXAM:  BP 110/80 (BP Location: Right Arm, Patient Position: Sitting, Cuff Size: Normal)   Pulse 93   Temp 98.3 F (36.8 C) (Oral)   Ht  (1.651 m)   Wt 182 lb 14.4 oz (83 kg)   BMI 30.44 kg/m   Body mass index is 30.44 kg/m. Wt Readings from Last 3 Encounters:  10/16/16 182 lb 14.4 oz (83 kg)  06/10/16 180 lb 12.8 oz (82 kg)  10/03/15 181 lb (82.1 kg)    Physical Exam: Vital signs reviewed [id number] is a well-developed well-nourished alert cooperative    who appearsr stated age in  no acute distress.  HEENT: normocephalic atraumatic , Eyes: PERRL EOM's full, conjunctiva clear, Nares: paten,t no deformity discharge or tenderness., Ears: no deformity EAC's clear TMs with normal landmarks. Mouth: clear OP, no lesions, edema.  Moist mucous membranes. Dentition in adequate repair. NECK: supple withoutr bruits. Thyroid easily palpable  No nodules non tender  CHEST/PULM:  Clear to auscultation and percussion breath sounds equal no wheeze , rales or rhonchi. No chest wall deformities or tenderness. Breast: normal by inspection . No dimpling, discharge, masses, tenderness or discharge . CV: PMI is nondisplaced, S1 S2 no gallops, murmurs, rubs. Peripheral pulses are full without delay.No JVD .  ABDOMEN: Bowel sounds normal nontender  No guard or rebound, no hepato splenomegal no CVA tenderness.  No hernia. Extremtities:  No clubbing cyanosis or edema, no acute joint swelling or redness no focal atrophy NEURO:  Oriented x3, cranial nerves 3-12 appear to be intact, no obvious focal weakness,gait within normal limits no abnormal reflexes or asymmetrical SKIN: No acute rashes normal turgor, color, no bruising or petechiae. PSYCH: Oriented, good eye contact, no obvious depression anxiety, cognition and judgment appear normal. LN: no cervical axillary inguinal adenopathy  PHQ-SADS Somatic:14 GAD7:13 mostly at work days PHQ9: 9 Difficulty : very difficult   BP Readings from Last 3 Encounters:  10/16/16 110/80  06/10/16 124/82  10/03/15 130/84    Lab results reviewed with patient   ASSESSMENT AND PLAN:  Discussed the following assessment and plan:  Visit for preventive health examination - Plan: Basic metabolic panel, CBC with Differential/Platelet, Hepatic function panel, Lipid panel, TSH  Hyperlipidemia, unspecified hyperlipidemia type - Plan: Basic metabolic panel, CBC with Differential/Platelet, Hepatic function panel, Lipid panel, TSH  Goiter - Plan: Basic metabolic  panel, CBC with Differential/Platelet, Hepatic function panel, Lipid panel, TSH Uncertain if labs covered by insurance but I think indicated for her  Job stress   And related anxiety   Good insight .    Patient Care Team: Madelin Headings, MD as PCP - General Carrington Clamp, MD as Consulting Physician (Obstetrics and Gynecology) Jacqlyn Krauss, MD as Referring Physician (Dermatology) Patient Instructions   Continue to attend to healthy lifestyle getting enough sleep some exercise works as a mild antidepressant. Eating well such as Mediterranean diet can also help feel better. Consider getting back with counseling because of your workplace stress but lifestyle self-care is the most important. Let you know results when back I cannot say. Insurance plan will cover the lab tests but I believe that you should have them done for the symptoms and your health risk. Laboratory monitoring is not needed every year.  Depends on risk.    Mediterranean Diet A Mediterranean diet refers to food and lifestyle choices that are based on the traditions of countries located on the [place]. This way of eating has been shown to help prevent certain conditions and improve outcomes for people who have chronic diseases, like kidney disease and heart disease. What are tips for following this plan? Lifestyle   Cook and eat meals together with your family, when possible.  Drink enough fluid to keep your urine clear or pale yellow.  Be physically active every day. This includes:  Aerobic exercise like running or swimming.  Leisure activities like gardening, walking, or housework.  Get 7-8 hours of sleep each night.  If recommended by your health care provider, drink red wine in moderation. This means 1 glass a day for nonpregnant women and 2 glasses a day for men. A glass of wine equals 5 oz (150 mL). Reading food labels   Check the serving size of packaged foods. For foods such as rice and  pasta, the serving size refers to the amount of cooked product, not dry.  Check the total fat in packaged foods. Avoid foods that have saturated fat or trans fats.  Check the ingredients list for added sugars, such as corn syrup. Shopping   At the grocery store, buy most of your food from the areas near the walls of the store. This includes:  Fresh fruits and vegetables (produce).  Grains, beans, nuts, and seeds. Some of these may be available in unpackaged forms or large amounts (in bulk).  Fresh seafood.  Poultry and eggs.  Low-fat dairy products.  Buy whole ingredients instead of prepackaged foods.  Buy fresh fruits and vegetables in-season from local farmers markets.  Buy frozen fruits and vegetables in resealable bags.  If you do not have access to quality fresh seafood, buy precooked frozen shrimp or canned fish, such as tuna, salmon, or sardines.  Buy small amounts of raw or cooked vegetables, salads, or olives from the deli or salad bar at your store.  Stock your pantry so you always have certain foods on hand, such as olive oil, canned tuna, canned tomatoes, rice, pasta, and beans. Cooking   Cook foods with extra-virgin olive oil instead of using butter or other vegetable oils.  Have meat as a side dish, and have vegetables or grains as your main dish. This means having meat in small portions or adding small amounts of meat to foods like pasta or stew.  Use beans or vegetables instead of meat in common dishes like chili or lasagna.  Experiment with different cooking methods. Try roasting or broiling vegetables instead of steaming or sauteing them.  Add frozen vegetables to soups, stews, pasta, or rice.  Add nuts or seeds for added healthy fat at each meal. You can add these to yogurt, salads, or vegetable dishes.  Marinate fish or vegetables using olive oil, lemon juice, garlic, and fresh herbs. Meal planning   Plan to eat 1 vegetarian meal one day each week.  Try to work up to 2 vegetarian meals, if possible.  Eat seafood 2 or more times a week.  Have healthy snacks readily available, such as:  Vegetable sticks with hummus.  Greek yogurt.  Fruit and nut trail mix.  Eat balanced meals throughout the week. This includes:  Fruit: 2-3 servings a day  Vegetables: 4-5 servings a day  Low-fat dairy: 2 servings a day  Fish, poultry, or lean meat: 1 serving a day  Beans and legumes: 2 or more servings a week  Nuts and seeds: 1-2 servings a day  Whole grains: 6-8 servings a day  Extra-virgin olive oil: 3-4 servings a day  Limit red meat and sweets to only a few servings a month What are my food choices?  Mediterranean diet  Recommended  Grains: Whole-grain pasta. Brown rice. Bulgar wheat. Polenta. Couscous. Whole-wheat bread. Orpah Cobb.  Vegetables: Artichokes. Beets. Broccoli. Cabbage. Carrots. Eggplant. Green beans. Chard. Kale. Spinach. Onions. Leeks. Peas. Squash. Tomatoes. Peppers. Radishes.  Fruits: Apples. Apricots. Avocado. Berries. Bananas. Cherries. Dates. Figs. Grapes. Lemons. Melon. Oranges. Peaches. Plums. Pomegranate.  Meats and other protein foods: Beans. Almonds. Sunflower seeds. Pine nuts. Peanuts. Cod. Salmon. Scallops. Shrimp. Tuna. Tilapia. Clams. Oysters. Eggs.  Dairy: Low-fat milk. Cheese. Greek yogurt.  Beverages: Water. Red wine. Herbal tea.  Fats and oils: Extra virgin olive oil. Avocado oil. Grape seed oil.  Sweets and desserts: [place] yogurt with honey. Baked apples. Poached pears. Trail mix.  Seasoning and other foods: Basil. Cilantro. Coriander. Cumin. Mint. Parsley. Sage. Rosemary. Tarragon. Garlic. Oregano. Thyme. Pepper. Balsalmic vinegar. Tahini. Hummus. Tomato sauce. Olives. Mushrooms.  Limit these  Grains: Prepackaged pasta or rice dishes. Prepackaged cereal with added sugar.  Vegetables: Deep fried potatoes (french fries).  Fruits: Fruit canned in syrup.  Meats and other protein  foods: Beef. Pork. Lamb. Poultry with skin. Hot dogs. Tomasa Blase.  Dairy: Ice cream. Sour cream. Whole milk.  Beverages: Juice. Sugar-sweetened soft drinks. Beer. Liquor and spirits.  Fats and oils: Butter. Canola oil. Vegetable oil. Beef fat (tallow). Lard.  Sweets and desserts: Cookies. Cakes. Pies. Candy.  Seasoning and other foods: Mayonnaise. Premade sauces and marinades.  The items listed may not be a complete list. Talk with your dietitian about what dietary choices are right for you. Summary  The Mediterranean diet includes both food and lifestyle choices.  Eat a variety of fresh fruits and vegetables, beans, nuts, seeds, and whole grains.  Limit the amount of red meat and sweets that you eat.  Talk with your health care provider about whether it is safe for you to drink red wine in moderation. This means 1 glass a day for nonpregnant women and 2 glasses a day for men. A glass of wine equals 5 oz (150 mL). This information is not intended to replace advice given to you by your health care provider. Make sure you discuss any questions you have with your health care provider. Document Released: 02/15/2016 Document Revised: 03/19/2016 Document Reviewed: 02/15/2016 Elsevier Interactive Patient Education  2017 [employer].    [place]. Panosh M.D.

## 2016-10-16 ENCOUNTER — Encounter: Payer: Self-pay | Admitting: Internal Medicine

## 2016-10-16 ENCOUNTER — Ambulatory Visit (INDEPENDENT_AMBULATORY_CARE_PROVIDER_SITE_OTHER): Payer: Managed Care, Other (non HMO) | Admitting: Internal Medicine

## 2016-10-16 VITALS — BP 110/80 | HR 93 | Temp 98.3°F | Ht 65.0 in | Wt 182.9 lb

## 2016-10-16 DIAGNOSIS — E785 Hyperlipidemia, unspecified: Secondary | ICD-10-CM

## 2016-10-16 DIAGNOSIS — E049 Nontoxic goiter, unspecified: Secondary | ICD-10-CM

## 2016-10-16 DIAGNOSIS — Z0000 Encounter for general adult medical examination without abnormal findings: Secondary | ICD-10-CM

## 2016-10-16 LAB — CBC WITH DIFFERENTIAL/PLATELET
BASOS PCT: 0.4 % (ref 0.0–3.0)
Basophils Absolute: 0 10*3/uL (ref 0.0–0.1)
Eosinophils Absolute: 0 10*3/uL (ref 0.0–0.7)
Eosinophils Relative: 0.4 % (ref 0.0–5.0)
HEMATOCRIT: 40.7 % (ref 36.0–46.0)
Hemoglobin: 13.6 g/dL (ref 12.0–15.0)
LYMPHS PCT: 39.7 % (ref 12.0–46.0)
Lymphs Abs: 3.9 10*3/uL (ref 0.7–4.0)
MCHC: 33.4 g/dL (ref 30.0–36.0)
MCV: 85.3 fl (ref 78.0–100.0)
MONOS PCT: 4.4 % (ref 3.0–12.0)
Monocytes Absolute: 0.4 10*3/uL (ref 0.1–1.0)
NEUTROS ABS: 5.4 10*3/uL (ref 1.4–7.7)
Neutrophils Relative %: 55.1 % (ref 43.0–77.0)
PLATELETS: 291 10*3/uL (ref 150.0–400.0)
RBC: 4.77 Mil/uL (ref 3.87–5.11)
RDW: 12.3 % (ref 11.5–15.5)
WBC: 9.9 10*3/uL (ref 4.0–10.5)

## 2016-10-16 LAB — BASIC METABOLIC PANEL
BUN: 8 mg/dL (ref 6–23)
CHLORIDE: 102 meq/L (ref 96–112)
CO2: 27 meq/L (ref 19–32)
Calcium: 9.4 mg/dL (ref 8.4–10.5)
Creatinine, Ser: 0.57 mg/dL (ref 0.40–1.20)
GFR: 127.62 mL/min (ref >60.00)
GLUCOSE: 77 mg/dL (ref 70–99)
Potassium: 3.9 mEq/L (ref 3.5–5.1)
SODIUM: 138 meq/L (ref 135–145)

## 2016-10-16 LAB — TSH: TSH: 0.64 u[IU]/mL (ref 0.35–4.50)

## 2016-10-16 LAB — HEPATIC FUNCTION PANEL
ALBUMIN: 4.6 g/dL (ref 3.5–5.2)
ALK PHOS: 55 U/L (ref 39–117)
ALT: 24 U/L (ref 0–35)
AST: 16 U/L (ref 0–37)
Bilirubin, Direct: 0.1 mg/dL (ref 0.0–0.3)
TOTAL PROTEIN: 7.4 g/dL (ref 6.0–8.3)
Total Bilirubin: 0.5 mg/dL (ref 0.2–1.2)

## 2016-10-16 LAB — LIPID PANEL
CHOL/HDL RATIO: 3
Cholesterol: 156 mg/dL (ref 0–200)
HDL: 55.4 mg/dL (ref >39.00)
LDL CALC: 85 mg/dL (ref 0–99)
NonHDL: 100.85
Triglycerides: 80 mg/dL (ref 0.0–149.0)
VLDL: 16 mg/dL (ref 0.0–40.0)

## 2016-10-16 NOTE — Patient Instructions (Signed)
Continue to attend to healthy lifestyle getting enough sleep some exercise works as a mild antidepressant. Eating well such as Mediterranean diet can also help feel better. Consider getting back with counseling because of your workplace stress but lifestyle self-care is the most important. Let you know results when back I cannot say. Insurance plan will cover the lab tests but I believe that you should have them done for the symptoms and your health risk. Laboratory monitoring is not needed every year. Depends on risk.    Mediterranean Diet A Mediterranean diet refers to food and lifestyle choices that are based on the traditions of countries located on the [place]. This way of eating has been shown to help prevent certain conditions and improve outcomes for people who have chronic diseases, like kidney disease and heart disease. What are tips for following this plan? Lifestyle   Cook and eat meals together with your family, when possible.  Drink enough fluid to keep your urine clear or pale yellow.  Be physically active every day. This includes:  Aerobic exercise like running or swimming.  Leisure activities like gardening, walking, or housework.  Get 7-8 hours of sleep each night.  If recommended by your health care provider, drink red wine in moderation. This means 1 glass a day for nonpregnant women and 2 glasses a day for men. A glass of wine equals 5 oz (150 mL). Reading food labels   Check the serving size of packaged foods. For foods such as rice and pasta, the serving size refers to the amount of cooked product, not dry.  Check the total fat in packaged foods. Avoid foods that have saturated fat or trans fats.  Check the ingredients list for added sugars, such as corn syrup. Shopping   At the grocery store, buy most of your food from the areas near the walls of the store. This includes:  Fresh fruits and vegetables (produce).  Grains, beans, nuts, and  seeds. Some of these may be available in unpackaged forms or large amounts (in bulk).  Fresh seafood.  Poultry and eggs.  Low-fat dairy products.  Buy whole ingredients instead of prepackaged foods.  Buy fresh fruits and vegetables in-season from local farmers markets.  Buy frozen fruits and vegetables in resealable bags.  If you do not have access to quality fresh seafood, buy precooked frozen shrimp or canned fish, such as tuna, salmon, or sardines.  Buy small amounts of raw or cooked vegetables, salads, or olives from the deli or salad bar at your store.  Stock your pantry so you always have certain foods on hand, such as olive oil, canned tuna, canned tomatoes, rice, pasta, and beans. Cooking   Cook foods with extra-virgin olive oil instead of using butter or other vegetable oils.  Have meat as a side dish, and have vegetables or grains as your main dish. This means having meat in small portions or adding small amounts of meat to foods like pasta or stew.  Use beans or vegetables instead of meat in common dishes like chili or lasagna.  Experiment with different cooking methods. Try roasting or broiling vegetables instead of steaming or sauteing them.  Add frozen vegetables to soups, stews, pasta, or rice.  Add nuts or seeds for added healthy fat at each meal. You can add these to yogurt, salads, or vegetable dishes.  Marinate fish or vegetables using olive oil, lemon juice, garlic, and fresh herbs. Meal planning   Plan to eat 1 vegetarian meal one  day each week. Try to work up to 2 vegetarian meals, if possible.  Eat seafood 2 or more times a week.  Have healthy snacks readily available, such as:  Vegetable sticks with hummus.  Greek yogurt.  Fruit and nut trail mix.  Eat balanced meals throughout the week. This includes:  Fruit: 2-3 servings a day  Vegetables: 4-5 servings a day  Low-fat dairy: 2 servings a day  Fish, poultry, or lean meat: 1 serving a  day  Beans and legumes: 2 or more servings a week  Nuts and seeds: 1-2 servings a day  Whole grains: 6-8 servings a day  Extra-virgin olive oil: 3-4 servings a day  Limit red meat and sweets to only a few servings a month What are my food choices?  Mediterranean diet  Recommended  Grains: Whole-grain pasta. Brown rice. Bulgar wheat. Polenta. Couscous. Whole-wheat bread. Orpah Cobb.  Vegetables: Artichokes. Beets. Broccoli. Cabbage. Carrots. Eggplant. Green beans. Chard. Kale. Spinach. Onions. Leeks. Peas. Squash. Tomatoes. Peppers. Radishes.  Fruits: Apples. Apricots. Avocado. Berries. Bananas. Cherries. Dates. Figs. Grapes. Lemons. Melon. Oranges. Peaches. Plums. Pomegranate.  Meats and other protein foods: Beans. Almonds. Sunflower seeds. Pine nuts. Peanuts. Cod. Salmon. Scallops. Shrimp. Tuna. Tilapia. Clams. Oysters. Eggs.  Dairy: Low-fat milk. Cheese. Greek yogurt.  Beverages: Water. Red wine. Herbal tea.  Fats and oils: Extra virgin olive oil. Avocado oil. Grape seed oil.  Sweets and desserts: [place] yogurt with honey. Baked apples. Poached pears. Trail mix.  Seasoning and other foods: Basil. Cilantro. Coriander. Cumin. Mint. Parsley. Sage. Rosemary. Tarragon. Garlic. Oregano. Thyme. Pepper. Balsalmic vinegar. Tahini. Hummus. Tomato sauce. Olives. Mushrooms.  Limit these  Grains: Prepackaged pasta or rice dishes. Prepackaged cereal with added sugar.  Vegetables: Deep fried potatoes (french fries).  Fruits: Fruit canned in syrup.  Meats and other protein foods: Beef. Pork. Lamb. Poultry with skin. Hot dogs. Tomasa Blase.  Dairy: Ice cream. Sour cream. Whole milk.  Beverages: Juice. Sugar-sweetened soft drinks. Beer. Liquor and spirits.  Fats and oils: Butter. Canola oil. Vegetable oil. Beef fat (tallow). Lard.  Sweets and desserts: Cookies. Cakes. Pies. Candy.  Seasoning and other foods: Mayonnaise. Premade sauces and marinades.  The items listed may not be a  complete list. Talk with your dietitian about what dietary choices are right for you. Summary  The Mediterranean diet includes both food and lifestyle choices.  Eat a variety of fresh fruits and vegetables, beans, nuts, seeds, and whole grains.  Limit the amount of red meat and sweets that you eat.  Talk with your health care provider about whether it is safe for you to drink red wine in moderation. This means 1 glass a day for nonpregnant women and 2 glasses a day for men. A glass of wine equals 5 oz (150 mL). This information is not intended to replace advice given to you by your health care provider. Make sure you discuss any questions you have with your health care provider. Document Released: 02/15/2016 Document Revised: 03/19/2016 Document Reviewed: 02/15/2016 Elsevier Interactive Patient Education  2017 [employer].

## 2016-11-26 ENCOUNTER — Other Ambulatory Visit: Payer: Self-pay | Admitting: Obstetrics and Gynecology

## 2016-11-28 LAB — CYTOLOGY - PAP

## 2017-03-28 ENCOUNTER — Encounter: Payer: Self-pay | Admitting: Internal Medicine

## 2017-05-13 ENCOUNTER — Ambulatory Visit (INDEPENDENT_AMBULATORY_CARE_PROVIDER_SITE_OTHER): Payer: Self-pay | Admitting: Internal Medicine

## 2017-05-13 ENCOUNTER — Encounter: Payer: Self-pay | Admitting: Internal Medicine

## 2017-05-13 VITALS — BP 124/82 | HR 87 | Temp 98.0°F | Wt 189.0 lb

## 2017-05-13 DIAGNOSIS — S161XXA Strain of muscle, fascia and tendon at neck level, initial encounter: Secondary | ICD-10-CM

## 2017-05-13 MED ORDER — CYCLOBENZAPRINE HCL 5 MG PO TABS: 5.0000 mg | ORAL_TABLET | Freq: Every day | ORAL | 0 refills | Status: AC
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
## 2017-05-13 NOTE — Patient Instructions (Signed)
Ice  And  Neck  Postural hygiene This neck strain usuallu gets  Better on its own with caution.  If  persistent or progressive 2 weeks   Then consider  Physical therapy  Or if worse .       Cervical Sprain A cervical sprain is a stretch or tear in one or more of the tough, cord-like tissues that connect bones (ligaments) in the neck. Cervical sprains can range from mild to severe. Severe cervical sprains can cause the spinal bones (vertebrae) in the neck to be unstable. This can lead to spinal cord damage and can result in serious nervous system problems. The amount of time that it takes for a cervical sprain to get better depends on the cause and extent of the injury. Most cervical sprains heal in 4-6 weeks. What are the causes? Cervical sprains may be caused by an injury (trauma), such as from a motor vehicle accident, a fall, or sudden forward and backward whipping movement of the head and neck (whiplash injury). Mild cervical sprains may be caused by wear and tear over time, such as from poor posture, sitting in a chair that does not provide support, or looking up or down for long periods of time. What increases the risk? The following factors may make you more likely to develop this condition:  Participating in activities that have a high risk of trauma to the neck. These include contact sports, auto racing, gymnastics, and diving.  Taking risks when driving or riding in a motor vehicle, such as speeding.  Having osteoarthritis of the spine.  Having poor strength and flexibility of the neck.  A previous neck injury.  Having poor posture.  Spending a lot of time in certain positions that put stress on the neck, such as sitting at a computer for long periods of time.  What are the signs or symptoms? Symptoms of this condition include:  Pain, soreness, stiffness, tenderness, swelling, or a burning sensation in the front, back, or sides of the neck.  Sudden tightening of neck  muscles that you cannot control (muscle spasms).  Pain in the shoulders or upper back.  Limited ability to move the neck.  Headache.  Dizziness.  Nausea.  Vomiting.  Weakness, numbness, or tingling in a hand or an arm.  Symptoms may develop right away after injury, or they may develop over a few days. In some cases, symptoms may go away with treatment and return (recur) over time. How is this diagnosed? This condition may be diagnosed based on:  Your medical history.  Your symptoms.  Any recent injuries or known neck problems that you have, such as arthritis in the neck.  A physical exam.  Imaging tests, such as: ? X-rays. ? MRI. ? CT scan.  How is this treated? This condition is treated by resting and icing the injured area and doing physical therapy exercises. Depending on the severity of your condition, treatment may also include:  Keeping your neck in place (immobilized) for periods of time. This may be done using: ? A cervical collar. This supports your chin and the back of your head. ? A cervical traction device. This is a sling that holds up your head. This removes weight and pressure from your neck, and it may help to relieve pain.  Medicines that help to relieve pain and inflammation.  Medicines that help to relax your muscles (muscle relaxants).  Surgery. This is rare.  Follow these instructions at home: If you have a cervical  collar:  Wear it as told by your health care provider. Do not remove the collar unless instructed by your health care provider.  Ask your health care provider before you make any adjustments to your collar.  If you have long hair, keep it outside of the collar.  Ask your health care provider if you can remove the collar for cleaning and bathing. If you are allowed to remove the collar for cleaning or bathing: ? Follow instructions from your health care provider about how to remove the collar safely. ? Clean the collar by wiping  it with mild soap and water and drying it completely. ? If your collar has removable pads, remove them every 1-2 days and wash them by hand with soap and water. Let them air-dry completely before you put them back in the collar. ? Check your skin under the collar for irritation or sores. If you see any, tell your health care provider. Managing pain, stiffness, and swelling  If directed, use a cervical traction device as told by your health care provider.  If directed, apply heat to the affected area before you do your physical therapy or as often as told by your health care provider. Use the heat source that your health care provider recommends, such as a moist heat pack or a heating pad. ? Place a towel between your skin and the heat source. ? Leave the heat on for 20-30 minutes. ? Remove the heat if your skin turns bright red. This is especially important if you are unable to feel pain, heat, or cold. You may have a greater risk of getting burned.  If directed, put ice on the affected area: ? Put ice in a plastic bag. ? Place a towel between your skin and the bag. ? Leave the ice on for 20 minutes, 2-3 times a day. Activity  Do not drive while wearing a cervical collar. If you do not have a cervical collar, ask your health care provider if it is safe to drive while your neck heals.  Do not drive or use heavy machinery while taking prescription pain medicine or muscle relaxants, unless your health care provider approves.  Do not lift anything that is heavier than 10 lb (4.5 kg) until your health care provider tells you that it is safe.  Rest as directed by your health care provider. Avoid positions and activities that make your symptoms worse. Ask your health care provider what activities are safe for you.  If physical therapy was prescribed, do exercises as told by your health care provider or physical therapist. General instructions  Take over-the-counter and prescription medicines  only as told by your health care provider.  Do not use any products that contain nicotine or tobacco, such as cigarettes and e-cigarettes. These can delay healing. If you need help quitting, ask your health care provider.  Keep all follow-up visits as told by your health care provider or physical therapist. This is important. How is this prevented? To prevent a cervical sprain from happening again:  Use and maintain good posture. Make any needed adjustments to your workstation to help you use good posture.  Exercise regularly as directed by your health care provider or physical therapist.  Avoid risky activities that may cause a cervical sprain.  Contact a health care provider if:  You have symptoms that get worse or do not get better after 2 weeks of treatment.  You have pain that gets worse or does not get better with medicine.  You develop new, unexplained symptoms.  You have sores or irritated skin on your neck from wearing your cervical collar. Get help right away if:  You have severe pain.  You develop numbness, tingling, or weakness in any part of your body.  You cannot move a part of your body (you have paralysis).  You have neck pain along with: ? Severe dizziness. ? Headache. Summary  A cervical sprain is a stretch or tear in one or more of the tough, cord-like tissues that connect bones (ligaments) in the neck.  Cervical sprains may be caused by an injury (trauma), such as from a motor vehicle accident, a fall, or sudden forward and backward whipping movement of the head and neck (whiplash injury).  Symptoms may develop right away after injury, or they may develop over a few days.  This condition is treated by resting and icing the injured area and doing physical therapy exercises. This information is not intended to replace advice given to you by your health care provider. Make sure you discuss any questions you have with your health care provider. Document  Released: 04/21/2007 Document Revised: 02/21/2016 Document Reviewed: 02/21/2016 Elsevier Interactive Patient Education  2017 [employer]

## 2018-02-04 NOTE — Progress Notes (Signed)
Chief Complaint  Patient presents with  . Sore Throat    x 1.5 weeks. Pt notes having a bite on her right elbow, bite or sore popped up on inner elbow and within 10-12hr she was not able to move her arm - topical cream and Bactrim given by [facility]. Pt states that symptoms started of sore throat, congestion, cough, sinus congestion with green mucous and pressure. Pt notes ear pain/stuffiness in Right ear. Pt now having deep congested cough.   . Vaginal Discharge    discolored discharge x 1-2 days, not sure if from Abx. Pt states that it is almost like a light rust color. Denies urinary ferequency or pain with urination, no vaginal odor    HPI: Felicia Shepard y.o. sda about  10 - 11 days  Of  Congestion and uri  and  Sinus stuff  But now after  Descent in plan a few days ago  Worse  Face pain .  Teet pain cough  No sb? Right ear feels more clogged   Teeth hurst when walking .    Began  Earlier this week.   Self treatment with cold No fever now may have at onset  No  Hx of yeat infection.  Denies risk of sti  Last period  Has iud.   No  spotting  Dr Henderson Cloud is  Gyne.  nouti sx .  See above   Had cellulitis on arm  Before all of this started.  ROS: See pertinent positives and negatives per HPI.  Past Medical History:  Diagnosis Date  . Abnormal Pap smear   . Amoxicillin rash    REACTION: rash- One time reaction when dx with Mono  . Hx of varicella     Family History  Problem Relation Age of Onset  . Cancer Mother        leaukemia  . Hypertension Father   . Autism Brother   . Hypertension Maternal Grandmother   . Heart disease Maternal Grandfather   . Diabetes Maternal Grandfather   . Heart disease Paternal Grandmother   . Hypertension Paternal Grandmother   . Diabetes Paternal Grandmother     Social History   Socioeconomic History  . Marital status: Married    Spouse name: Not on file  . Number of children: Not on file  . Years of education: Not on file  . Highest  education level: Not on file  Occupational History  . Not on file  Social Needs  . Financial resource strain: Not on file  . Food insecurity:    Worry: Not on file    Inability: Not on file  . Transportation needs:    Medical: Not on file    Non-medical: Not on file  Tobacco Use  . Smoking status: Never Smoker  . Smokeless tobacco: Never Used  Substance and Sexual Activity  . Alcohol use: No    Alcohol/week: 0.0 oz  . Drug use: No  . Sexual activity: Yes    Birth control/protection: IUD  Lifestyle  . Physical activity:    Days per week: Not on file    Minutes per session: Not on file  . Stress: Not on file  Relationships  . Social connections:    Talks on phone: Not on file    Gets together: Not on file    Attends religious service: Not on file    Active member of club or organization: Not on file    Attends meetings of clubs  or organizations: Not on file    Relationship status: Not on file  Other Topics Concern  . Not on file  Social History Narrative   Usually receives 8 hours of sleep per night   2 people living in the home.   No animals in the home.   College degree ex married employed Automotive engineermarketing director   Gravida 1 para 1   Social alcohol no tobacco local learning and home.    Outpatient Medications Prior to Visit  Medication Sig Dispense Refill  . cyclobenzaprine (FLEXERIL) 5 MG tablet Take 1 tablet (5 mg total) at bedtime by mouth. Prn or tid for muscle spasm. 24 tablet 0  . ibuprofen (ADVIL,MOTRIN) 200 MG tablet Take 200 mg every 6 (six) hours as needed by mouth.    . Multiple Vitamin (MULTI-VITAMIN DAILY PO) Take by mouth.    . Probiotic Product (PROBIOTIC DAILY PO) Take by mouth.     No facility-administered medications prior to visit.      EXAM:  BP 100/70 (BP Location: Right Arm, Patient Position: Sitting, Cuff Size: Normal)   Pulse 83   Temp 98.4 F (36.9 C) (Oral)   Wt 193 lb 3.2 oz (87.6 kg)   SpO2 98%   BMI 32.15 kg/m   Body mass index  is 32.15 kg/m. WDWN in NAD  quiet respirations; very  congested  somewhat hoarse. Non toxic . Wheezy cough like  HEENT: Normocephalic ;atraumatic , Eyes;  PERRL, EOMs  Full, lids and conjunctiva clear,,Ears: no deformities, canals nl, TM landmarks normal left right  Clear distored , Nose: no deformity or discharge but congested;face minimally tender bilateral maxillary and some frontal  Mouth : OP clear without lesion or edema . Neck: Supple without adenopathy or masses or bruits Chest:  Exp sounds nl air movement no rales rhonchi coughing  ... CV:  S1-S2 no gallops or murmurs peripheral perfusion is normal Skin :nl perfusion and no acute rashes   ASSESSMENT AND PLAN:  Discussed the following assessment and plan:  Other acute sinusitis, recurrence not specified  Acute respiratory infection  Vaginal discharge R  ear not infected but  distorted light  reflex  Progressing sx after plane  flight.  -Patient advised to return or notify health care team  if symptoms worsen ,persist or new concerns arise.  Patient Instructions  Antibiotic for sinusitis   Chest  Exam no pneumonia .   Saline warm compresses .  If vaginal sx  persistent or progressive get back with us  Or your  [facility]      Jaquell Seddon K. Marlean Mortell M.D.

## 2018-02-05 ENCOUNTER — Encounter: Payer: Self-pay | Admitting: Internal Medicine

## 2018-02-05 ENCOUNTER — Ambulatory Visit (INDEPENDENT_AMBULATORY_CARE_PROVIDER_SITE_OTHER): Payer: 59 | Admitting: Internal Medicine

## 2018-02-05 VITALS — BP 100/70 | HR 83 | Temp 98.4°F | Wt 193.2 lb

## 2018-02-05 DIAGNOSIS — J018 Other acute sinusitis: Secondary | ICD-10-CM | POA: Diagnosis not present

## 2018-02-05 DIAGNOSIS — J22 Unspecified acute lower respiratory infection: Secondary | ICD-10-CM | POA: Diagnosis not present

## 2018-02-05 DIAGNOSIS — N898 Other specified noninflammatory disorders of vagina: Secondary | ICD-10-CM

## 2018-02-05 MED ORDER — DOXYCYCLINE HYCLATE 100 MG PO TABS: 100.0000 mg | ORAL_TABLET | Freq: Two times a day (BID) | ORAL | 0 refills | Status: AC

## 2018-02-05 NOTE — Patient Instructions (Addendum)
Antibiotic for sinusitis   Chest  Exam no pneumonia .   Saline warm compresses .  If vaginal sx  persistent or progressive get back with us  Or your  [facility]

## 2018-09-24 ENCOUNTER — Encounter: Payer: Self-pay | Admitting: Internal Medicine

## 2019-01-13 ENCOUNTER — Other Ambulatory Visit: Payer: Self-pay | Admitting: *Deleted

## 2019-01-13 DIAGNOSIS — Z20822 Contact with and (suspected) exposure to covid-19: Secondary | ICD-10-CM

## 2019-01-18 LAB — NOVEL CORONAVIRUS, NAA: SARS-CoV-2, NAA: NOT DETECTED

## 2019-05-31 ENCOUNTER — Other Ambulatory Visit: Payer: Self-pay

## 2019-05-31 DIAGNOSIS — Z20822 Contact with and (suspected) exposure to covid-19: Secondary | ICD-10-CM

## 2019-06-01 LAB — NOVEL CORONAVIRUS, NAA: SARS-CoV-2, NAA: NOT DETECTED

## 2020-01-20 LAB — HM PAP SMEAR

## 2020-08-23 ENCOUNTER — Ambulatory Visit (HOSPITAL_COMMUNITY): Payer: Self-pay

## 2023-09-22 ENCOUNTER — Ambulatory Visit: Payer: Self-pay | Admitting: Physical Therapy

## 2023-09-29 ENCOUNTER — Ambulatory Visit: Payer: Self-pay | Admitting: Physical Therapy

## 2023-10-06 ENCOUNTER — Encounter: Payer: Self-pay | Admitting: Physical Therapy

## 2023-10-13 ENCOUNTER — Encounter: Payer: Self-pay | Admitting: Physical Therapy

## 2024-04-23 ENCOUNTER — Other Ambulatory Visit: Payer: Self-pay | Admitting: Obstetrics and Gynecology

## 2024-04-23 DIAGNOSIS — Z1231 Encounter for screening mammogram for malignant neoplasm of breast: Secondary | ICD-10-CM

## 2024-05-14 ENCOUNTER — Ambulatory Visit
Admission: RE | Admit: 2024-05-14 | Discharge: 2024-05-14 | Disposition: A | Discharge status: Home / Self Care | Payer: Self-pay | Source: Ambulatory Visit | Attending: Obstetrics and Gynecology | Admitting: Obstetrics and Gynecology

## 2024-05-14 DIAGNOSIS — Z1231 Encounter for screening mammogram for malignant neoplasm of breast: Secondary | ICD-10-CM

## 2024-06-28 ENCOUNTER — Other Ambulatory Visit: Payer: Self-pay | Admitting: Endocrinology

## 2024-06-28 DIAGNOSIS — E288 Other ovarian dysfunction: Secondary | ICD-10-CM

## 2024-07-21 ENCOUNTER — Ambulatory Visit
Admission: RE | Admit: 2024-07-21 | Discharge: 2024-07-21 | Disposition: A | Discharge status: Home / Self Care | Source: Ambulatory Visit | Attending: Endocrinology | Admitting: Endocrinology

## 2024-07-21 DIAGNOSIS — E288 Other ovarian dysfunction: Secondary | ICD-10-CM
